# Patient Record
Sex: Male | Born: 1940 | Race: White | Hispanic: No | Marital: Married | Smoking: Former smoker
Health system: Southern US, Community
[De-identification: ages and names within clinical notes are randomized; demographics above are authoritative.]

## PROBLEM LIST (undated history)

## (undated) DIAGNOSIS — I10 Essential (primary) hypertension: Secondary | ICD-10-CM

## (undated) DIAGNOSIS — I4891 Unspecified atrial fibrillation: Secondary | ICD-10-CM

## (undated) DIAGNOSIS — N400 Enlarged prostate without lower urinary tract symptoms: Secondary | ICD-10-CM

## (undated) DIAGNOSIS — G459 Transient cerebral ischemic attack, unspecified: Secondary | ICD-10-CM

## (undated) HISTORY — PX: TONSILLECTOMY: SUR1361

---

## 2005-02-11 ENCOUNTER — Ambulatory Visit: Payer: Self-pay | Admitting: Gastroenterology

## 2006-03-31 ENCOUNTER — Ambulatory Visit: Payer: Self-pay | Admitting: Unknown Physician Specialty

## 2016-10-02 ENCOUNTER — Emergency Department
Admission: EM | Admit: 2016-10-02 | Discharge: 2016-10-02 | Disposition: A | Discharge status: Home / Self Care | Payer: Medicare Other | Attending: Student in an Organized Health Care Education/Training Program | Admitting: Student in an Organized Health Care Education/Training Program

## 2016-10-02 DIAGNOSIS — L230 Allergic contact dermatitis due to metals: Secondary | ICD-10-CM | POA: Diagnosis not present

## 2016-10-02 DIAGNOSIS — L089 Local infection of the skin and subcutaneous tissue, unspecified: Secondary | ICD-10-CM | POA: Diagnosis not present

## 2016-10-02 DIAGNOSIS — R21 Rash and other nonspecific skin eruption: Secondary | ICD-10-CM | POA: Diagnosis present

## 2016-10-02 MED ORDER — HYDROXYZINE HCL 25 MG PO TABS: 25.0000 mg | ORAL_TABLET | Freq: Three times a day (TID) | ORAL | 0 refills | Status: DC | PRN

## 2016-10-02 MED ORDER — SULFAMETHOXAZOLE-TRIMETHOPRIM 800-160 MG PO TABS: 1.0000 | ORAL_TABLET | Freq: Two times a day (BID) | ORAL | 0 refills | Status: DC

## 2016-10-02 MED ORDER — HYDROCORTISONE 1 % EX OINT: 1.0000 "application " | TOPICAL_OINTMENT | Freq: Two times a day (BID) | CUTANEOUS | 0 refills | Status: DC

## 2016-10-02 NOTE — ED Notes (Signed)
See triage note  States he developed a rash about 1-2  weeks ago  States areas are mainly to left hand between fingers

## 2016-10-02 NOTE — Discharge Instructions (Signed)
Advised Epsom salts soak 5-10 minutes twice a day.

## 2016-10-02 NOTE — ED Triage Notes (Signed)
Pt reports was at dinner party 2 weeks ago, pet a dog and shortly after broke into rash on left hand.

## 2016-10-02 NOTE — ED Provider Notes (Signed)
[facility] Emergency Department Provider Note   ____________________________________________   First MD Initiated Contact with Patient 10/02/16 1512     (approximate)  I have reviewed the triage vital signs and the nursing notes.   HISTORY  Chief Complaint Rash    HPI Ethan ACCOMANDO Sr. is a 76 y.o. male patient complaining of rash to the dorsal aspect the left hand for 2 weeks. [place] his [place]'s dog shortly after it came in from outside. Patient states the next morning he awakened with some vesicle lesions in intense itching on the left hand. No palliative measures for complaint. Patient presents today with edema to the left hand and ruptured vesicle lesions and erythema. Patient denies pain with this complaint.   History reviewed. No pertinent past medical history.  There are no active problems to display for this patient.   History reviewed. No pertinent surgical history.  Prior to Admission medications   Medication Sig Start Date End Date Taking? Authorizing Provider  diltiazem (CARTIA XT) 180 MG 24 hr capsule Take 180 mg by mouth daily.   Yes [provider]  rivaroxaban (XARELTO) 20 MG TABS tablet Take 20 mg by mouth daily with supper.   Yes [provider]  hydrocortisone 1 % ointment Apply 1 application topically 2 (two) times daily. 10/02/16   Joni Reining, PA-C  hydrOXYzine (ATARAX/VISTARIL) 25 MG tablet Take 1 tablet (25 mg total) by mouth 3 (three) times daily as needed. 10/02/16   Joni Reining, PA-C  sulfamethoxazole-trimethoprim (BACTRIM DS,SEPTRA DS) 800-160 MG tablet Take 1 tablet by mouth 2 (two) times daily. 10/02/16   Joni Reining, PA-C    Allergies Patient has no known allergies.  No family history on file.  Social History Social History  Substance Use Topics  . Smoking status: Never Smoker  . Smokeless tobacco: Never Used  . Alcohol use No     Review of Systems Constitutional: No fever/chills Eyes: No visual changes. ENT: No sore throat. Cardiovascular: Denies chest pain. Respiratory: Denies shortness of breath. Gastrointestinal: No abdominal pain.  No nausea, no vomiting.  No diarrhea.  No constipation. Genitourinary: Negative for dysuria. Musculoskeletal: Negative for back pain. Skin:positive for rash. Neurological: Negative for headaches, focal weakness or numbness.  ____________________________________________   PHYSICAL EXAM:  VITAL SIGNS: ED Triage Vitals [10/02/16 1405]  Enc Vitals Group     BP 129/86     Pulse Rate 82     Resp 18     Temp (!) 97.5 F (36.4 C)     Temp Source Oral     SpO2 99 %     Weight 175 lb (79.4 kg)     Height  (1.803 m)     Head Circumference      Peak Flow      Pain Score 0     Pain Loc      Pain Edu?      Excl. in GC?     Constitutional: Alert and oriented. Well appearing and in no acute distress. Cardiovascular: Normal rate, regular rhythm. Grossly normal heart sounds.  Good peripheral circulation. Respiratory: Normal respiratory effort.  No retractions. Lungs CTAB. Neurologic:  Normal speech and language. No gross focal neurologic deficits are appreciated. No gait instability. Skin:  Skin is warm, dry and intact.edematous erythematous dorsal aspect of the right hand withruptured vesicle lesions. Psychiatric: Mood and affect are normal. Speech and behavior are  normal.  ____________________________________________   LABS (all labs ordered are listed, but only abnormal results are displayed)  Labs Reviewed - No data to display ____________________________________________  EKG   ____________________________________________  RADIOLOGY  No results found.  ____________________________________________   PROCEDURES  Procedure(s) performed: None  Procedures  Critical Care performed: No  ____________________________________________   INITIAL  IMPRESSION / ASSESSMENT AND PLAN / ED COURSE  Pertinent labs & imaging results that were available during my care of the patient were reviewed by me and considered in my medical decision making (see chart for details).  Patient present with edema a to the dorsal aspect of the left hand for approximately 2 weeks. Patient given a history of contact dermatitis.atient given discharge care instructions. Patient advow-up PCP in one week if no improvement. Return by the ED if condition worsens.      ____________________________________________   FINAL CLINICAL IMPRESSION(S) / ED DIAGNOSES  Final diagnoses:  Allergic contact dermatitis due to metals  Bacterial skin infection of upper extremity      NEW MEDICATIONS STARTED DURING THIS VISIT:  New Prescriptions   HYDROCORTISONE 1 % OINTMENT    Apply 1 application topically 2 (two) times daily.   HYDROXYZINE (ATARAX/VISTARIL) 25 MG TABLET    Take 1 tablet (25 mg total) by mouth 3 (three) times daily as needed.   SULFAMETHOXAZOLE-TRIMETHOPRIM (BACTRIM DS,SEPTRA DS) 800-160 MG TABLET    Take 1 tablet by mouth 2 (two) times daily.     Note:  This document was prepared using Dragon voice recognition software and may include unintentional dictation errors.    Joni Reining, PA-C 10/02/16 1543    Willy Eddy, MD 10/02/16 [phone]

## 2020-01-13 ENCOUNTER — Ambulatory Visit (HOSPITAL_COMMUNITY)
Admission: RE | Admit: 2020-01-13 | Discharge: 2020-01-13 | Disposition: A | Discharge status: Home / Self Care | Payer: Medicare Other | Source: Ambulatory Visit | Attending: Pulmonary Disease | Admitting: Pulmonary Disease

## 2020-01-13 ENCOUNTER — Other Ambulatory Visit: Payer: Self-pay | Admitting: Adult Health

## 2020-01-13 ENCOUNTER — Other Ambulatory Visit: Payer: Self-pay | Admitting: Nurse Practitioner

## 2020-01-13 ENCOUNTER — Telehealth (HOSPITAL_COMMUNITY): Payer: Self-pay | Admitting: Nurse Practitioner

## 2020-01-13 DIAGNOSIS — U071 COVID-19: Secondary | ICD-10-CM

## 2020-01-13 DIAGNOSIS — Z23 Encounter for immunization: Secondary | ICD-10-CM | POA: Diagnosis not present

## 2020-01-13 MED ORDER — SODIUM CHLORIDE 0.9 % IV SOLN: Freq: Once | INTRAVENOUS | Status: DC

## 2020-01-13 MED ORDER — DIPHENHYDRAMINE HCL 50 MG/ML IJ SOLN: 50.0000 mg | Freq: Once | INTRAMUSCULAR | Status: DC | PRN

## 2020-01-13 MED ORDER — EPINEPHRINE 0.3 MG/0.3ML IJ SOAJ: 0.3000 mg | Freq: Once | INTRAMUSCULAR | Status: DC | PRN

## 2020-01-13 MED ORDER — METHYLPREDNISOLONE SODIUM SUCC 125 MG IJ SOLR: 125.0000 mg | Freq: Once | INTRAMUSCULAR | Status: DC | PRN

## 2020-01-13 MED ORDER — ALBUTEROL SULFATE HFA 108 (90 BASE) MCG/ACT IN AERS: 2.0000 | INHALATION_SPRAY | Freq: Once | RESPIRATORY_TRACT | Status: DC | PRN

## 2020-01-13 MED ORDER — FAMOTIDINE IN NACL 20-0.9 MG/50ML-% IV SOLN: 20.0000 mg | Freq: Once | INTRAVENOUS | Status: DC | PRN

## 2020-01-13 MED ORDER — SODIUM CHLORIDE 0.9 % IV SOLN: INTRAVENOUS | Status: DC | PRN

## 2020-01-13 MED ORDER — SODIUM CHLORIDE 0.9 % IV SOLN
1200.0000 mg | Freq: Once | INTRAVENOUS | Status: DC
  Administered 2020-01-13: 15:00:00 1200 mg via INTRAVENOUS

## 2020-01-13 NOTE — Telephone Encounter (Signed)
I connected by phone with Ethan Jude Sr. on 01/13/2020 at 1:24 PM to discuss the potential use of a treatment for mild to moderate COVID-19 viral infection in non-hospitalized patients.  This patient is a 79 y.o. male that meets the FDA criteria for Emergency Use Authorization of bamlanivimab/etesevimab, casirivimab\imdevimab, or sotrovimab  Has a (+) direct SARS-CoV-2 viral test result  Has mild or moderate COVID-19   Is ? 81 years of age and weighs ? 40 kg  Is NOT hospitalized due to COVID-19  Is NOT requiring oxygen therapy or requiring an increase in baseline oxygen flow rate due to COVID-19  Is within 10 days of symptom onset  Has at least one of the high risk factor(s) for progression to severe COVID-19 and/or hospitalization as defined in EUA.  Specific high risk criteria : Older age (>/= 81 yo)   Patient has received vaccines and booster. However, due to age, he is at risk of complications of covid. His wife received mab. Patient understands drug shortages and would like to be placed on cancellation list in case drug becomes available. Last date eligible for infusion based on narrowed criteria is: 12/31.    I have spoken and communicated the following to the patient or parent/caregiver:  1. FDA has authorized the emergency use of bamlanivimab/etesevimab, casirivimab\imdevimab, or sotrovimab for the treatment of mild to moderate COVID-19 in adults and pediatric patients with positive results of direct SARS-CoV-2 viral testing who are 70 years of age and older weighing at least 40 kg, and who are at high risk for progressing to severe COVID-19 and/or hospitalization.  2. The significant known and potential risks and benefits of bamlanivimab/etesevimab, casirivimab\imdevimab, or sotrovimab, and the extent to which such potential risks and benefits are unknown.  3. Information on available alternative treatments and the risks and benefits of those alternatives, including clinical  trials.  4. Patients treated with bamlanivimab/etesevimab, casirivimab\imdevimab, or sotrovimab should continue to self-isolate and use infection control measures (e.g., wear mask, isolate, social distance, avoid sharing personal items, clean and disinfect "high touch" surfaces, and frequent handwashing) according to CDC guidelines.   5. The patient or parent/caregiver has the option to accept or refuse bamlanivimab/etesevimab, casirivimab\imdevimab, or sotrovimab.  After reviewing this information with the patient, the patient has agreed to receive one of the available covid 19 monoclonal antibodies and will be provided an appropriate fact sheet prior to infusion.Consuello Masse, DNP, AGNP-C [phone] (Infusion Center Hotline)

## 2020-01-13 NOTE — Progress Notes (Signed)
Patient reviewed Fact Sheet for Patients, Parents, and Caregivers for Emergency Use Authorization (EUA) of casi for the Treatment of Coronavirus. Patient also reviewed and is agreeable to the estimated cost of treatment. Patient is agreeable to proceed.   

## 2020-01-13 NOTE — Progress Notes (Signed)
I connected by phone with Ethan Jude Sr. on 01/13/2020 at 3:08 PM to discuss the potential use of a new treatment for mild to moderate COVID-19 viral infection in non-hospitalized patients.  This patient is a 79 y.o. male that meets the FDA criteria for Emergency Use Authorization of COVID monoclonal antibody casirivimab/imdevimab, bamlanivimab/etesevimab, or sotrovimab.  Has a (+) direct SARS-CoV-2 viral test result  Has mild or moderate COVID-19   Is NOT hospitalized due to COVID-19  Is within 10 days of symptom onset  Has at least one of the high risk factor(s) for progression to severe COVID-19 and/or hospitalization as defined in EUA.  Specific high risk criteria : Older age (>/= 81 yo) and Cardiovascular disease or hypertension   I have spoken and communicated the following to the patient or parent/caregiver regarding COVID monoclonal antibody treatment:  1. FDA has authorized the emergency use for the treatment of mild to moderate COVID-19 in adults and pediatric patients with positive results of direct SARS-CoV-2 viral testing who are 51 years of age and older weighing at least 40 kg, and who are at high risk for progressing to severe COVID-19 and/or hospitalization.  2. The significant known and potential risks and benefits of COVID monoclonal antibody, and the extent to which such potential risks and benefits are unknown.  3. Information on available alternative treatments and the risks and benefits of those alternatives, including clinical trials.  4. Patients treated with COVID monoclonal antibody should continue to self-isolate and use infection control measures (e.g., wear mask, isolate, social distance, avoid sharing personal items, clean and disinfect "high touch" surfaces, and frequent handwashing) according to CDC guidelines.   5. The patient or parent/caregiver has the option to accept or refuse COVID monoclonal antibody treatment.  After reviewing this information  with the patient, the patient has agreed to receive one of the available covid 19 monoclonal antibodies and will be provided an appropriate fact sheet prior to infusion. Noreene Filbert, NP 01/13/2020 3:08 PM

## 2020-01-13 NOTE — Progress Notes (Signed)
  Diagnosis: COVID-19  Physician:  Patrick Wright  Procedure: Covid Infusion Clinic Med: casirivimab\imdevimab infusion - Provided patient with casirivimab\imdevimab fact sheet for patients, parents and caregivers prior to infusion.  Complications: No immediate complications noted.  Discharge: Discharged home   Nicolle Heward L 01/13/2020   

## 2020-01-13 NOTE — Discharge Instructions (Addendum)
10 Things You Can Do to Manage Your COVID-19 Symptoms at Home If you have possible or confirmed COVID-19: 1. Stay home from work and school. And stay away from other public places. If you must go out, avoid using any kind of public transportation, ridesharing, or taxis. 2. Monitor your symptoms carefully. If your symptoms get worse, call your healthcare provider immediately. 3. Get rest and stay hydrated. 4. If you have a medical appointment, call the healthcare provider ahead of time and tell them that you have or may have COVID-19. 5. For medical emergencies, call 911 and notify the dispatch personnel that you have or may have COVID-19. 6. Cover your cough and sneezes with a tissue or use the inside of your elbow. 7. Wash your hands often with soap and water for at least 20 seconds or clean your hands with an alcohol-based hand sanitizer that contains at least 60% alcohol. 8. As much as possible, stay in a specific room and away from other people in your home. Also, you should use a separate bathroom, if available. If you need to be around other people in or outside of the home, wear a mask. 9. Avoid sharing personal items with other people in your household, like dishes, towels, and bedding. 10. Clean all surfaces that are touched often, like counters, tabletops, and doorknobs. Use household cleaning sprays or wipes according to the label instructions. cdc.gov/coronavirus 07/18/2018 This information is not intended to replace advice given to you by your health care provider. Make sure you discuss any questions you have with your health care provider. Document Revised: 12/20/2018 Document Reviewed: 12/20/2018 Elsevier Patient Education  2020 Elsevier Inc. What types of side effects do monoclonal antibody drugs cause?  Common side effects  In general, the more common side effects caused by monoclonal antibody drugs include: . Allergic reactions, such as hives or itching . Flu-like signs and  symptoms, including chills, fatigue, fever, and muscle aches and pains . Nausea, vomiting . Diarrhea . Skin rashes . Low blood pressure   The CDC is recommending patients who receive monoclonal antibody treatments wait at least 90 days before being vaccinated.  Currently, there are no data on the safety and efficacy of mRNA COVID-19 vaccines in persons who received monoclonal antibodies or convalescent plasma as part of COVID-19 treatment. Based on the estimated half-life of such therapies as well as evidence suggesting that reinfection is uncommon in the 90 days after initial infection, vaccination should be deferred for at least 90 days, as a precautionary measure until additional information becomes available, to avoid interference of the antibody treatment with vaccine-induced immune responses. If you have any questions or concerns after the infusion please call the Advanced Practice Provider on call at [phone]. This number is ONLY intended for your use regarding questions or concerns about the infusion post-treatment side-effects.  Please do not provide this number to others for use. For return to work notes please contact your primary care provider.   If someone you know is interested in receiving treatment please have them call the COVID hotline at [phone].   

## 2020-07-24 ENCOUNTER — Other Ambulatory Visit: Payer: Self-pay

## 2020-07-24 ENCOUNTER — Emergency Department
Admission: EM | Admit: 2020-07-24 | Discharge: 2020-07-24 | Disposition: A | Discharge status: Home / Self Care | Payer: Medicare Other | Attending: Emergency Medicine | Admitting: Emergency Medicine

## 2020-07-24 DIAGNOSIS — R040 Epistaxis: Secondary | ICD-10-CM | POA: Insufficient documentation

## 2020-07-24 LAB — CBC
HCT: 45.1 % (ref 39.0–52.0)
Hemoglobin: 15.3 g/dL (ref 13.0–17.0)
MCH: 32.2 pg (ref 26.0–34.0)
MCHC: 33.9 g/dL (ref 30.0–36.0)
MCV: 94.9 fL (ref 80.0–100.0)
Platelets: 224 10*3/uL (ref 150–400)
RBC: 4.75 MIL/uL (ref 4.22–5.81)
RDW: 16.3 % — ABNORMAL HIGH (ref 11.5–15.5)
WBC: 10.7 10*3/uL — ABNORMAL HIGH (ref 4.0–10.5)
nRBC: 0 % (ref 0.0–0.2)

## 2020-07-24 MED ORDER — OXYMETAZOLINE HCL 0.05 % NA SOLN
1.0000 | Freq: Once | NASAL | Status: AC
  Administered 2020-07-24: 1 via NASAL
  Filled 2020-07-24: qty 30

## 2020-07-24 NOTE — ED Triage Notes (Signed)
Pt to ER via POV with complaints of a nose bleed on the right side that he has been unable to stop bleeding. Reports a steady trickle of blood that started last night. Reports taking xalerto (has afib).

## 2020-07-24 NOTE — ED Provider Notes (Signed)
[facility] Emergency Department Provider Note ____________________________________________   Event Date/Time   First MD Initiated Contact with Patient 07/24/20 1741     (approximate)  I have reviewed the triage vital signs and the nursing notes.   HISTORY  Chief Complaint Epistaxis  HPI Ethan Ayers is a 80 y.o. male with history of a-fib on xarelto presents to the emergency department for treatment and evaluation of right epistaxis that started about 10am. He has packed it with toilet paper, but states it has still trickled all day. He has not clamped it or applied pressure.     No past medical history on file.  There are no problems to display for this patient.   No past surgical history on file.  Prior to Admission medications   Medication Sig Start Date End Date Taking? Authorizing Provider  diltiazem (CARTIA XT) 180 MG 24 hr capsule Take 180 mg by mouth daily.    [provider]  hydrocortisone 1 % ointment Apply 1 application topically 2 (two) times daily. 10/02/16   Joni Reining, PA-C  hydrOXYzine (ATARAX/VISTARIL) 25 MG tablet Take 1 tablet (25 mg total) by mouth 3 (three) times daily as needed. 10/02/16   Joni Reining, PA-C  rivaroxaban (XARELTO) 20 MG TABS tablet Take 20 mg by mouth daily with supper.    [provider]    Allergies Patient has no known allergies.  No family history on file.  Social History Social History   Tobacco Use   Smoking status: Never   Smokeless tobacco: Never  Substance Use Topics   Alcohol use: No   Drug use: No    Review of Systems  Constitutional: No fever/chills Eyes: No visual changes. ENT: Positive for epistaxis Cardiovascular: Denies chest pain. Respiratory: Denies shortness of breath. Gastrointestinal: No abdominal pain.  No nausea, no vomiting.  No diarrhea.  No constipation. Genitourinary: Negative for dysuria. Musculoskeletal: Negative for back pain. Skin:  Negative for rash. Neurological: Negative for headaches, focal weakness or numbness. ____________________________________________   PHYSICAL EXAM:  VITAL SIGNS: ED Triage Vitals  Enc Vitals Group     BP 07/24/20 1731 (!) 147/98     Pulse Rate 07/24/20 1731 85     Resp 07/24/20 1731 16     Temp 07/24/20 1731 (!) 96.9 F (36.1 C)     Temp Source 07/24/20 1731 Axillary     SpO2 07/24/20 1731 98 %     Weight 07/24/20 1732 165 lb (74.8 kg)     Height 07/24/20 1732 5\' 11"  (1.803 m)     Head Circumference --      Peak Flow --      Pain Score 07/24/20 1732 0     Pain Loc --      Pain Edu? --      Excl. in GC? --     Constitutional: Alert and oriented. Well appearing and in no acute distress. Eyes: Conjunctivae are normal. PERRL. EOMI. Head: Atraumatic. Nose: Toilet paper packing and large clot pulled from right nostril. Mouth/Throat: Mucous membranes are moist.  Oropharynx non-erythematous. No obvious blood in posterior oropharynx. Neck: No stridor.   Hematological/Lymphatic/Immunilogical: No cervical lymphadenopathy. Cardiovascular: Normal rate, regular rhythm. Grossly normal heart sounds.  Good peripheral circulation. Respiratory: Normal respiratory effort.  No retractions. Lungs CTAB. Gastrointestinal: Soft and nontender. No distention. No abdominal bruits. No CVA tenderness. Genitourinary:  Musculoskeletal: No lower extremity tenderness nor edema.  No joint effusions. Neurologic:  Normal speech and language.  No gross focal neurologic deficits are appreciated. No gait instability. Skin:  Skin is warm, dry and intact. No rash noted. Psychiatric: Mood and affect are normal. Speech and behavior are normal.  ____________________________________________   LABS (all labs ordered are listed, but only abnormal results are displayed)  Labs Reviewed  CBC - Abnormal; Notable for the following components:      Result Value   WBC 10.7 (*)    RDW 16.3 (*)    All other components  within normal limits   ____________________________________________  EKG  Not indicated. ____________________________________________  RADIOLOGY  ED MD interpretation:    Not indicated.  I, Kem Boroughs, personally viewed and evaluated these images (plain radiographs) as part of my medical decision making, as well as reviewing the written report by the radiologist.  Official radiology report(s): No results found.  ____________________________________________   PROCEDURES  Procedure(s) performed (including Critical Care):  Procedures  ____________________________________________   INITIAL IMPRESSION / ASSESSMENT AND PLAN     80 year old male presenting to the ER for treatment of epistaxis. See HPI and assessment. Neosynephrine nasal spray instilled into right nostril and clamp applied.  DIFFERENTIAL DIAGNOSIS  Epistaxis; anemia  ED COURSE  CBC reassuring. Clamp left in place 10 minutes then removed. Will reassess in about 20 minutes for return of bleeding.  No bleeding after clamp being off for 25 minutes. Nasal spray and nose clamp given to patient who was advised to insert 2 sprays and clamp for 10-15 minutes if bleeding returns. If after that time bleeding has not stopped, he is to reapply the clamp and return to the ER for packing.     ___________________________________________   FINAL CLINICAL IMPRESSION(S) / ED DIAGNOSES  Final diagnoses:  Epistaxis     ED Discharge Orders     None        [name] was evaluated in Emergency Department on 07/24/2020 for the symptoms described in the history of present illness. He was evaluated in the context of the global COVID-19 pandemic, which necessitated consideration that the patient might be at risk for infection with the SARS-CoV-2 virus that causes COVID-19. Institutional protocols and algorithms that pertain to the evaluation of patients at risk for COVID-19 are in a state of rapid change based on  information released by regulatory bodies including the CDC and federal and state organizations. These policies and algorithms were followed during the patient's care in the ED.   Note:  This document was prepared using Dragon voice recognition software and may include unintentional dictation errors.    Chinita Pester, FNP 07/24/20 2208    Chesley Noon, MD 07/28/20 [phone]

## 2020-08-18 ENCOUNTER — Other Ambulatory Visit: Payer: Self-pay | Admitting: Orthopedic Surgery

## 2020-08-18 DIAGNOSIS — M5442 Lumbago with sciatica, left side: Secondary | ICD-10-CM

## 2020-08-18 DIAGNOSIS — M5441 Lumbago with sciatica, right side: Secondary | ICD-10-CM

## 2020-08-18 DIAGNOSIS — M5416 Radiculopathy, lumbar region: Secondary | ICD-10-CM

## 2020-08-27 ENCOUNTER — Ambulatory Visit
Admission: RE | Admit: 2020-08-27 | Discharge: 2020-08-27 | Disposition: A | Discharge status: Home / Self Care | Payer: Medicare Other | Source: Ambulatory Visit | Attending: Orthopedic Surgery | Admitting: Orthopedic Surgery

## 2020-08-27 ENCOUNTER — Other Ambulatory Visit: Payer: Self-pay

## 2020-08-27 DIAGNOSIS — M5441 Lumbago with sciatica, right side: Secondary | ICD-10-CM | POA: Diagnosis present

## 2020-08-27 DIAGNOSIS — M5442 Lumbago with sciatica, left side: Secondary | ICD-10-CM | POA: Diagnosis not present

## 2020-08-27 DIAGNOSIS — M5416 Radiculopathy, lumbar region: Secondary | ICD-10-CM | POA: Diagnosis present

## 2020-09-22 ENCOUNTER — Emergency Department: Payer: Medicare Other

## 2020-09-22 ENCOUNTER — Other Ambulatory Visit: Payer: Self-pay

## 2020-09-22 ENCOUNTER — Observation Stay
Admission: EM | Admit: 2020-09-22 | Discharge: 2020-09-24 | Disposition: A | Discharge status: Home / Self Care | Payer: Medicare Other | Attending: Internal Medicine | Admitting: Internal Medicine

## 2020-09-22 DIAGNOSIS — Z7901 Long term (current) use of anticoagulants: Secondary | ICD-10-CM | POA: Diagnosis not present

## 2020-09-22 DIAGNOSIS — R2681 Unsteadiness on feet: Secondary | ICD-10-CM | POA: Diagnosis not present

## 2020-09-22 DIAGNOSIS — Z20822 Contact with and (suspected) exposure to covid-19: Secondary | ICD-10-CM | POA: Insufficient documentation

## 2020-09-22 DIAGNOSIS — R4702 Dysphasia: Secondary | ICD-10-CM | POA: Diagnosis not present

## 2020-09-22 DIAGNOSIS — Z79899 Other long term (current) drug therapy: Secondary | ICD-10-CM | POA: Diagnosis not present

## 2020-09-22 DIAGNOSIS — I10 Essential (primary) hypertension: Secondary | ICD-10-CM | POA: Diagnosis not present

## 2020-09-22 DIAGNOSIS — R1310 Dysphagia, unspecified: Secondary | ICD-10-CM | POA: Insufficient documentation

## 2020-09-22 DIAGNOSIS — Y900 Blood alcohol level of less than 20 mg/100 ml: Secondary | ICD-10-CM | POA: Insufficient documentation

## 2020-09-22 DIAGNOSIS — R471 Dysarthria and anarthria: Secondary | ICD-10-CM | POA: Insufficient documentation

## 2020-09-22 DIAGNOSIS — R4182 Altered mental status, unspecified: Principal | ICD-10-CM | POA: Diagnosis present

## 2020-09-22 DIAGNOSIS — I482 Chronic atrial fibrillation, unspecified: Secondary | ICD-10-CM | POA: Diagnosis not present

## 2020-09-22 DIAGNOSIS — R4701 Aphasia: Secondary | ICD-10-CM

## 2020-09-22 DIAGNOSIS — R2990 Unspecified symptoms and signs involving the nervous system: Secondary | ICD-10-CM

## 2020-09-22 DIAGNOSIS — R410 Disorientation, unspecified: Secondary | ICD-10-CM | POA: Diagnosis not present

## 2020-09-22 LAB — DIFFERENTIAL
Abs Immature Granulocytes: 0.08 10*3/uL — ABNORMAL HIGH (ref 0.00–0.07)
Basophils Absolute: 0.1 10*3/uL (ref 0.0–0.1)
Basophils Relative: 1 %
Eosinophils Absolute: 0.5 10*3/uL (ref 0.0–0.5)
Eosinophils Relative: 5 %
Immature Granulocytes: 1 %
Lymphocytes Relative: 19 %
Lymphs Abs: 1.7 10*3/uL (ref 0.7–4.0)
Monocytes Absolute: 1.1 10*3/uL — ABNORMAL HIGH (ref 0.1–1.0)
Monocytes Relative: 12 %
Neutro Abs: 5.9 10*3/uL (ref 1.7–7.7)
Neutrophils Relative %: 62 %

## 2020-09-22 LAB — COMPREHENSIVE METABOLIC PANEL
ALT: 19 U/L (ref 0–44)
AST: 25 U/L (ref 15–41)
Albumin: 3.5 g/dL (ref 3.5–5.0)
Alkaline Phosphatase: 68 U/L (ref 38–126)
Anion gap: 8 (ref 5–15)
BUN: 22 mg/dL (ref 8–23)
CO2: 26 mmol/L (ref 22–32)
Calcium: 8.8 mg/dL — ABNORMAL LOW (ref 8.9–10.3)
Chloride: 101 mmol/L (ref 98–111)
Creatinine, Ser: 1.39 mg/dL — ABNORMAL HIGH (ref 0.61–1.24)
GFR, Estimated: 51 mL/min — ABNORMAL LOW (ref >60)
Glucose, Bld: 102 mg/dL — ABNORMAL HIGH (ref 70–99)
Potassium: 3.6 mmol/L (ref 3.5–5.1)
Sodium: 135 mmol/L (ref 135–145)
Total Bilirubin: 0.5 mg/dL (ref 0.3–1.2)
Total Protein: 6.7 g/dL (ref 6.5–8.1)

## 2020-09-22 LAB — CBC
HCT: 36.3 % — ABNORMAL LOW (ref 39.0–52.0)
Hemoglobin: 12.9 g/dL — ABNORMAL LOW (ref 13.0–17.0)
MCH: 33.1 pg (ref 26.0–34.0)
MCHC: 35.5 g/dL (ref 30.0–36.0)
MCV: 93.1 fL (ref 80.0–100.0)
Platelets: 295 10*3/uL (ref 150–400)
RBC: 3.9 MIL/uL — ABNORMAL LOW (ref 4.22–5.81)
RDW: 14.4 % (ref 11.5–15.5)
WBC: 9.4 10*3/uL (ref 4.0–10.5)
nRBC: 0 % (ref 0.0–0.2)

## 2020-09-22 LAB — CBG MONITORING, ED: Glucose-Capillary: 98 mg/dL (ref 70–99)

## 2020-09-22 LAB — RESP PANEL BY RT-PCR (FLU A&B, COVID) ARPGX2
Influenza A by PCR: NEGATIVE
Influenza B by PCR: NEGATIVE
SARS Coronavirus 2 by RT PCR: NEGATIVE

## 2020-09-22 LAB — PROTIME-INR
INR: 3.6 — ABNORMAL HIGH (ref 0.8–1.2)
Prothrombin Time: 35.6 seconds — ABNORMAL HIGH (ref 11.4–15.2)

## 2020-09-22 LAB — APTT: aPTT: 45 seconds — ABNORMAL HIGH (ref 24–36)

## 2020-09-22 MED ORDER — MAGNESIUM HYDROXIDE 400 MG/5ML PO SUSP
30.0000 mL | Freq: Every day | ORAL | Status: DC | PRN
  Filled 2020-09-22: qty 30

## 2020-09-22 MED ORDER — ACETAMINOPHEN 650 MG RE SUPP: 650.0000 mg | Freq: Four times a day (QID) | RECTAL | Status: DC | PRN

## 2020-09-22 MED ORDER — RIVAROXABAN 20 MG PO TABS
20.0000 mg | ORAL_TABLET | Freq: Every day | ORAL | Status: DC
  Administered 2020-09-23: 17:00:00 20 mg via ORAL
  Filled 2020-09-22 (×2): qty 1

## 2020-09-22 MED ORDER — ONDANSETRON HCL 4 MG PO TABS: 4.0000 mg | ORAL_TABLET | Freq: Four times a day (QID) | ORAL | Status: DC | PRN

## 2020-09-22 MED ORDER — ACETAMINOPHEN 325 MG PO TABS: 650.0000 mg | ORAL_TABLET | Freq: Four times a day (QID) | ORAL | Status: DC | PRN

## 2020-09-22 MED ORDER — ASPIRIN EC 81 MG PO TBEC
81.0000 mg | DELAYED_RELEASE_TABLET | Freq: Every day | ORAL | Status: DC
  Administered 2020-09-23: 81 mg via ORAL
  Filled 2020-09-22: qty 1

## 2020-09-22 MED ORDER — DILTIAZEM HCL ER COATED BEADS 120 MG PO CP24
120.0000 mg | ORAL_CAPSULE | Freq: Every day | ORAL | Status: DC
  Administered 2020-09-23 – 2020-09-24 (×2): 120 mg via ORAL
  Filled 2020-09-22 (×2): qty 1

## 2020-09-22 MED ORDER — ONDANSETRON HCL 4 MG/2ML IJ SOLN: 4.0000 mg | Freq: Four times a day (QID) | INTRAMUSCULAR | Status: DC | PRN

## 2020-09-22 MED ORDER — SODIUM CHLORIDE 0.9 % IV SOLN: INTRAVENOUS | Status: DC

## 2020-09-22 MED ORDER — TAMSULOSIN HCL 0.4 MG PO CAPS
0.4000 mg | ORAL_CAPSULE | Freq: Every day | ORAL | Status: DC
  Administered 2020-09-23 – 2020-09-24 (×2): 0.4 mg via ORAL
  Filled 2020-09-22 (×2): qty 1

## 2020-09-22 MED ORDER — TRAZODONE HCL 50 MG PO TABS: 25.0000 mg | ORAL_TABLET | Freq: Every evening | ORAL | Status: DC | PRN

## 2020-09-22 MED ORDER — STROKE: EARLY STAGES OF RECOVERY BOOK: Freq: Once | Status: AC

## 2020-09-22 NOTE — Progress Notes (Signed)
CODE STROKE- PHARMACY COMMUNICATION   Time CODE STROKE called/page received:2125  Time response to CODE STROKE was made (in person or via phone): 2130  Time Stroke Kit retrieved from [place] (only if needed):N/A  Name of Provider/Nurse contacted:Beth  Last dose Xarelto 5:15pm this evening per report  No past medical history on file. Prior to Admission medications   Medication Sig Start Date End Date Taking? Authorizing Provider  diltiazem (CARTIA XT) 180 MG 24 hr capsule Take 180 mg by mouth daily.    [provider]  hydrocortisone 1 % ointment Apply 1 application topically 2 (two) times daily. 10/02/16   Sable Feil, PA-C  hydrOXYzine (ATARAX/VISTARIL) 25 MG tablet Take 1 tablet (25 mg total) by mouth 3 (three) times daily as needed. 10/02/16   Sable Feil, PA-C  rivaroxaban (XARELTO) 20 MG TABS tablet Take 20 mg by mouth daily with supper.    [provider]    Lu Duffel ,PharmD Clinical Pharmacist  09/22/2020  9:25 PM

## 2020-09-22 NOTE — ED Notes (Signed)
Dr. Laural Benes speaking with patient via teleneurology

## 2020-09-22 NOTE — ED Notes (Signed)
MD back at the bedside 

## 2020-09-22 NOTE — ED Provider Notes (Signed)
[facility] Emergency Department Provider Note   ____________________________________________   None    (approximate)  I have reviewed the triage vital signs and the nursing notes.   HISTORY  Chief Complaint Altered Mental Status (Pt's wife called EMS for altered mental status onset around 1745. Pt normally A&Ox4 at baseline but was only x2 today with slurred speech that was resolved prior to EMS arrival. Pt's wife reports similar episodes over past couple months. Pt has current prostate infection and is taking cipro x 7 days. )  EM caveat acuity, patient with difficulty speaking and what appears to be expressive aphasia  HPI Ethan Ayers is a 80 y.o. male with a history of A. Fib  A wife with him reports that he was doing fine at 5:30 PM.  He had Xarelto at 5:30 PM this evening which is his typical medication for A. fib.  At 6 PM he was getting ready to say the blessing for dinner when she noticed that he was having difficulty speaking could not get his words to form correctly.  Wife called EMS this evening as persistent difficulty with speaking and seems to be slightly confused.  He has been and is currently on about 1 week of treatment for ciprofloxacin for prostatitis as well seems to be improving  He is otherwise been in his normal state of health  Patient himself having some difficulty speaking, seems slightly frustrated with difficulty getting words.  He denies being any pain or discomfort though.  He does nod and speaks but with some difficulty that he is having trouble speaking   No past medical history on file.  Patient Active Problem List   Diagnosis Date Noted   Altered mental status 09/22/2020    No past surgical history on file.  Prior to Admission medications   Medication Sig Start Date End Date Taking? Authorizing Provider  diltiazem (CARDIZEM CD) 120 MG 24 hr capsule Take 120 mg by mouth daily. 07/23/20  Yes [provider]  rivaroxaban (XARELTO) 20 MG TABS tablet Take 20 mg by mouth daily with supper.   Yes [provider]  tamsulosin (FLOMAX) 0.4 MG CAPS capsule Take 0.4 mg by mouth daily. 09/08/20  Yes [provider]  hydrocortisone 1 % ointment Apply 1 application topically 2 (two) times daily. Patient not taking: Reported on 09/22/2020 10/02/16   Joni Reining, PA-C  hydrOXYzine (ATARAX/VISTARIL) 25 MG tablet Take 1 tablet (25 mg total) by mouth 3 (three) times daily as needed. Patient not taking: Reported on 09/22/2020 10/02/16   Joni Reining, PA-C    Allergies Patient has no known allergies.  No family history on file.  Social History Social History   Tobacco Use   Smoking status: Never   Smokeless tobacco: Never  Substance Use Topics   Alcohol use: No   Drug use: No    Review of Systems Constitutional: No fever/chills but did recently get treated for prostatitis Eyes: No visual changes. ENT: No pain.  No neck pain Cardiovascular: Denies chest pain. Respiratory: Denies shortness of breath. Gastrointestinal: No abdominal pain.   Genitourinary: Treatment for prostatitis Skin: Negative for rash. Neurological: Negative for headaches or areas of weakness but having difficulty speaking.    ____________________________________________   PHYSICAL EXAM:  VITAL SIGNS: ED Triage Vitals  Enc Vitals Group     BP 09/22/20 2108 (!) 155/105     Pulse Rate 09/22/20 2108 89     Resp 09/22/20 2108 20  Temp 09/22/20 2108 98 F (36.7 C)     Temp Source 09/22/20 2108 Oral     SpO2 09/22/20 2107 96 %     Weight 09/22/20 2111 233 lb 4 oz (105.8 kg)     Height 09/22/20 2111 5\' 11"  (1.803 m)     Head Circumference --      Peak Flow --      Pain Score 09/22/20 2111 0     Pain Loc --      Pain Edu? --      Excl. in GC? --     Constitutional: Alert and oriented to self and being in hospital. Well appearing and in no acute distress. Eyes: Conjunctivae are normal. Head:  Atraumatic. Nose: No congestion/rhinnorhea. Mouth/Throat: Mucous membranes are moist. Neck: No stridor.  Cardiovascular: Normal rate, irregular rhythm. Grossly normal heart sounds.  Good peripheral circulation. Respiratory: Normal respiratory effort.  No retractions. Lungs CTAB. Gastrointestinal: Soft and nontender. No distention. Musculoskeletal: No lower extremity tenderness nor edema. Neurologic:  Normal speech and language. No gross focal neurologic deficits are appreciated. NIH = 3. VAN negative (no noted muscular weakness)  Skin:  Skin is warm, dry and intact. No rash noted. Psychiatric: Mood and affect are normal. Speech and behavior are normal.  ____________________________________________   LABS (all labs ordered are listed, but only abnormal results are displayed)  Labs Reviewed  PROTIME-INR - Abnormal; Notable for the following components:      Result Value   Prothrombin Time 35.6 (*)    INR 3.6 (*)    All other components within normal limits  APTT - Abnormal; Notable for the following components:   aPTT 45 (*)    All other components within normal limits  CBC - Abnormal; Notable for the following components:   RBC 3.90 (*)    Hemoglobin 12.9 (*)    HCT 36.3 (*)    All other components within normal limits  DIFFERENTIAL - Abnormal; Notable for the following components:   Monocytes Absolute 1.1 (*)    Abs Immature Granulocytes 0.08 (*)    All other components within normal limits  COMPREHENSIVE METABOLIC PANEL - Abnormal; Notable for the following components:   Glucose, Bld 102 (*)    Creatinine, Ser 1.39 (*)    Calcium 8.8 (*)    GFR, Estimated 51 (*)    All other components within normal limits  RESP PANEL BY RT-PCR (FLU A&B, COVID) ARPGX2  URINE DRUG SCREEN, QUALITATIVE ([facility] ONLY)  URINALYSIS, ROUTINE W REFLEX MICROSCOPIC  ETHANOL  HEMOGLOBIN A1C  LIPID PANEL  BASIC METABOLIC PANEL  CBC  CBG MONITORING, ED    ____________________________________________  EKG  Is reviewed inter by me at 2115 Heart rate 99 QRS 89 QTc 460 A. fib, no evidence of ischemia ____________________________________________  RADIOLOGY  CT HEAD CODE STROKE WO CONTRAST  Result Date: 09/22/2020 CLINICAL DATA:  Code stroke. Initial evaluation for altered mental status, slurred speech. EXAM: CT HEAD WITHOUT CONTRAST TECHNIQUE: Contiguous axial images were obtained from the base of the skull through the vertex without intravenous contrast. COMPARISON:  None available. FINDINGS: Brain: Moderately advanced age-related cerebral atrophy with chronic small vessel ischemic disease. No acute intracranial hemorrhage. No visible acute large vessel territory infarct. No mass lesion, midline shift or mass effect. No hydrocephalus or extra-axial fluid collection. Vascular: No convincing hyperdense vessel. Scattered vascular calcifications noted within the carotid siphons. Skull: Scalp soft tissues and calvarium within normal limits. Sinuses/Orbits: Globes and orbital soft tissues demonstrate no acute  finding. Mild scattered mucosal thickening noted within the ethmoidal air cells and maxillary sinuses. Paranasal sinuses are otherwise clear. No mastoid effusion. Other: None. ASPECTS [facility] Stroke Program Early CT Score) - Ganglionic level infarction (caudate, lentiform nuclei, internal capsule, insula, M1-M3 cortex): 7 - Supraganglionic infarction (M4-M6 cortex): 3 Total score (0-10 with 10 being normal): 10 IMPRESSION: 1. No acute intracranial infarct or other abnormality. 2. ASPECTS is 10. 3. 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 Critical Value/emergent results were called by telephone at the time of interpretation on 09/22/2020 at 9:33 pm to provider Haylin Camilli , who verbally acknowledged these results. Electronically Signed   By: Rise Mu M.D.   On: 09/22/2020 21:36     CT head reviewed and  discussed with radiologist negative for acute infarct or hemorrhage. ____________________________________________   PROCEDURES  Procedure(s) performed: None  Procedures  Critical Care performed: Yes, see critical care note(s)  CRITICAL CARE Performed by: Sharyn Creamer   Total critical care time: 30 minutes  Critical care time was exclusive of separately billable procedures and treating other patients.  Critical care was necessary to treat or prevent imminent or life-threatening deterioration.  Critical care was time spent personally by me on the following activities: development of treatment plan with patient and/or surrogate as well as nursing, discussions with consultants, evaluation of patient's response to treatment, examination of patient, obtaining history from patient or surrogate, ordering and performing treatments and interventions, ordering and review of laboratory studies, ordering and review of radiographic studies, pulse oximetry and re-evaluation of patient's condition.  ____________________________________________   INITIAL IMPRESSION / ASSESSMENT AND PLAN / ED COURSE  Pertinent labs & imaging results that were available during my care of the patient were reviewed by me and considered in my medical decision making (see chart for details).   Patient presents for somewhat acute onset of dysarthria and confusion.  In the setting of A. fib in the immediacy of symptoms of stroke or intracranial hemorrhage are high in the differential, or other potential acute sudden onset etiology neurologic etiology, toxic metabolic etc.  No cardiac symptoms.  No chest pain.  Did have recent illness including prostatitis and on ciprofloxacin which could be a potential causative agent, but unclear at this time without further work-up which is ordered and pending  Clinical Course as of 09/22/20 2350  Tue Sep 22, 2020  2121 I evaluated the patient shortly after his arrival, noted to have  expressive aphasia.  His wife affirms the same notes that this started approximately 6 PM when he had difficulty saying the blessing at dinner.  He does have a history of A. fib and we discussed and he is on Xarelto which the patient's wife advises he took at 5:30 PM this evening.  Patient activated as an acute code stroke within the window for possible thrombolytic however given his use of Xarelto which she is currently taking and last took at 5:30 PM he is not a thrombolytic candidate at this time.  Teleneurology stroke consult has been activated [MQ]  2136 D/W radiologist, CT head negative for acute finding. [MQ]  2200 Discussed with our telemetry neurologist Dr. Laural Benes.  He advises recommendation for admission with the work-up for stroke including MRI MRA, but does not see evidence of LVO.  MRI MRA could be part of his admission work-up.  In addition we will work-up broadly for acute metabolic encephalopathic etiologies as well, of note the patient's INR is 3.6 which seems unusually high  for patient on Xarelto.  Await metabolic panel including renal function.  No clinical history to suggest acute hemorrhage or bleeding [MQ]  2249 Admission discussed with Dr. Arville CareMansy [MQ]    Clinical Course User Index [MQ] Sharyn CreamerQuale, Belmont Valli, MD    Differential diagnosis for acute onset of confusion and dysarthria is broad, and will work that up broadly but in the immediate context code stroke activated.  Patient not a thrombolytic candidate given his use of Xarelto this evening  Patient does not have any clear infectious symptoms, but has been on treatment for prostatitis with ciprofloxacin.   No associated cardiac or pulmonary symptoms.  Vital signs reassuring with exception to hypertension we will monitor.  ----------------------------------------- 11:50 PM on 09/22/2020 ----------------------------------------- Admitted to hospitalist service for further  work-up ____________________________________________   FINAL CLINICAL IMPRESSION(S) / ED DIAGNOSES  Final diagnoses:  Disorientation  Expressive aphasia        Note:  This document was prepared using Dragon voice recognition software and may include unintentional dictation errors       Sharyn CreamerQuale, Zackary Mckeone, MD 09/22/20 2350

## 2020-09-22 NOTE — H&P (Signed)
[facility]   PATIENT NAME: Ethan Ayers    MR#:  [id number]  DATE OF BIRTH:  August 25, 1940  DATE OF ADMISSION:  09/22/2020  PRIMARY CARE PHYSICIAN: Danella Penton, MD   Patient is coming from: Home  REQUESTING/REFERRING PHYSICIAN: Sharyn Creamer, MD  CHIEF COMPLAINT:   Chief Complaint  Patient presents with   Altered Mental Status    Pt's wife called EMS for altered mental status onset around 1745. Pt normally A&Ox4 at baseline but was only x2 today with slurred speech that was resolved prior to EMS arrival. Pt's wife reports similar episodes over past couple months. Pt has current prostate infection and is taking cipro x 7 days.     HISTORY OF PRESENT ILLNESS:  Ethan Ayers is a 80 y.o. Caucasian male with medical history significant for paroxysmal atrial fibrillation, BPH and hypertension, who presented to the ER with acute onset of altered mental status with confusion as well as expressive dysphasia with dysarthria that started today prior to coming to the ER.  He denied any paresthesias or focal muscle weakness.  No headache or dizziness or blurred vision.  He denied any chest pain or palpitations.  He is usually alert and oriented x3 and during the interview is not oriented to time.  No fever or chills.  No chest pain or palpitations.  No bleeding diathesis.  He was recently treated 3 prostatitis with p.o. Cipro for about a week and has  1 tablet left.  He took his last Xarelto this p.m.  ED Course: When he came to the ER blood pressure was 155/105 and later 129/106 with otherwise normal vital signs.  Labs revealed a creatinine 1.39 with a BUN of 22 and otherwise unremarkable CMP.  CBC showed mild anemia with hemoglobin of 12.9 and hematocrit 36.3.  INR was 3.6 on p.o. Xarelto which she took this afternoon and PTT 35.6 with PTT of 45.  Influenza antigens and COVID-19 PCR came back negative. EKG as reviewed by me : EKG showed atrial fibrillation with controlled ventricular sponsor  89 with Q waves in anteroseptal leads. Imaging: Noncontrast head CT scan revealed no acute intracranial normalities.  It showed moderately advanced cerebral atrophy with chronic small vessel ischemic disease.  The patient will be admitted to an observation medically monitored bed for further evaluation and management. PAST MEDICAL HISTORY:  Chronic atrial fibrillation, BPH and hypertension. PAST SURGICAL HISTORY:  He remembers he had tonsillectomy. SOCIAL HISTORY:   Social History   Tobacco Use   Smoking status: Never   Smokeless tobacco: Never  Substance Use Topics   Alcohol use: No   No history of tobacco EtOH abuse or illicit drug use. FAMILY HISTORY:  Positive for coronary artery disease in his father and TIAs in his mother. DRUG ALLERGIES:  No Known Allergies  REVIEW OF SYSTEMS:   ROS As per history of present illness. All pertinent systems were reviewed above. Constitutional, HEENT, cardiovascular, respiratory, GI, GU, musculoskeletal, neuro, psychiatric, endocrine, integumentary and hematologic systems were reviewed and are otherwise negative/unremarkable except for positive findings mentioned above in the HPI.   MEDICATIONS AT HOME:   Prior to Admission medications   Medication Sig Start Date End Date Taking? Authorizing Provider  diltiazem (CARDIZEM CD) 120 MG 24 hr capsule Take 120 mg by mouth daily. 07/23/20  Yes [provider]  rivaroxaban (XARELTO) 20 MG TABS tablet Take 20 mg by mouth daily with supper.   Yes [provider]  tamsulosin (  FLOMAX) 0.4 MG CAPS capsule Take 0.4 mg by mouth daily. 09/08/20  Yes [provider]  hydrocortisone 1 % ointment Apply 1 application topically 2 (two) times daily. Patient not taking: Reported on 09/22/2020 10/02/16   Joni Reining, PA-C  hydrOXYzine (ATARAX/VISTARIL) 25 MG tablet Take 1 tablet (25 mg total) by mouth 3 (three) times daily as needed. Patient not taking: Reported on 09/22/2020 10/02/16    Joni Reining, PA-C      VITAL SIGNS:  Blood pressure (!) 129/106, pulse 91, temperature 98 F (36.7 C), temperature source Oral, resp. rate (!) 21, height 5\' 11"  (1.803 m), weight 105.8 kg, SpO2 94 %.  PHYSICAL EXAMINATION:  Physical Exam  GENERAL:  80 y.o.-year-old Caucasian male patient lying in the bed with no acute distress.  EYES: Pupils equal, round, reactive to light and accommodation. No scleral icterus. Extraocular muscles intact.  HEENT: Head atraumatic, normocephalic. Oropharynx and nasopharynx clear.  NECK:  Supple, no jugular venous distention. No thyroid enlargement, no tenderness.  LUNGS: Normal breath sounds bilaterally, no wheezing, rales,rhonchi or crepitation. No use of accessory muscles of respiration.  CARDIOVASCULAR: Regular rate and rhythm, S1, S2 normal. No murmurs, rubs, or gallops.  ABDOMEN: Soft, nondistended, nontender. Bowel sounds present. No organomegaly or mass.  EXTREMITIES: No pedal edema, cyanosis, or clubbing.  NEUROLOGIC: Cranial nerves II through XII are intact. Muscle strength 5/5 in all extremities. Sensation intact. Gait not checked.  PSYCHIATRIC: The patient is alert and oriented x 2 to person and to place but not to time.  Normal affect and good eye contact. SKIN: No obvious rash, lesion, or ulcer.   LABORATORY PANEL:   CBC Recent Labs  Lab 09/22/20 2117  WBC 9.4  HGB 12.9*  HCT 36.3*  PLT 295   ------------------------------------------------------------------------------------------------------------------  Chemistries  Recent Labs  Lab 09/22/20 2117  NA 135  K 3.6  CL 101  CO2 26  GLUCOSE 102*  BUN 22  CREATININE 1.39*  CALCIUM 8.8*  AST 25  ALT 19  ALKPHOS 68  BILITOT 0.5   ------------------------------------------------------------------------------------------------------------------  Cardiac Enzymes No results for input(s): TROPONINI in the last 168  hours. ------------------------------------------------------------------------------------------------------------------  RADIOLOGY:  CT HEAD CODE STROKE WO CONTRAST  Result Date: 09/22/2020 CLINICAL DATA:  Code stroke. Initial evaluation for altered mental status, slurred speech. EXAM: CT HEAD WITHOUT CONTRAST TECHNIQUE: Contiguous axial images were obtained from the base of the skull through the vertex without intravenous contrast. COMPARISON:  None available. FINDINGS: Brain: Moderately advanced age-related cerebral atrophy with chronic small vessel ischemic disease. No acute intracranial hemorrhage. No visible acute large vessel territory infarct. No mass lesion, midline shift or mass effect. No hydrocephalus or extra-axial fluid collection. Vascular: No convincing hyperdense vessel. Scattered vascular calcifications noted within the carotid siphons. Skull: Scalp soft tissues and calvarium within normal limits. Sinuses/Orbits: Globes and orbital soft tissues demonstrate no acute finding. Mild scattered mucosal thickening noted within the ethmoidal air cells and maxillary sinuses. Paranasal sinuses are otherwise clear. No mastoid effusion. Other: None. ASPECTS [facility] Stroke Program Early CT Score) - Ganglionic level infarction (caudate, lentiform nuclei, internal capsule, insula, M1-M3 cortex): 7 - Supraganglionic infarction (M4-M6 cortex): 3 Total score (0-10 with 10 being normal): 10 IMPRESSION: 1. No acute intracranial infarct or other abnormality. 2. ASPECTS is 10. 3. 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 Critical Value/emergent results were called by telephone at the time of interpretation on 09/22/2020 at 9:33 pm to provider MARK QUALE , who verbally acknowledged these  results. Electronically Signed   By: Rise Mu M.D.   On: 09/22/2020 21:36      IMPRESSION AND PLAN:  Active Problems:   Altered mental status  1.  Altered mental status with  confusion with associated expressive dysphagia and dysarthria, concerning for TIA, rule out evolving CVA. - The patient will be admitted to an observation medically monitored bed. - We will follow neurochecks every 4 hours for 24 hours. - We will obtain a brain MRI with MRA of the head and neck As well as 2D echo for further assessment. - The patient will be placed on aspirin and statin therapy. - We will check fasting lipids in AM. - Neurology consult will be obtained - I notified Dr. Iver Nestle about the patient. - PT/OT and ST consults will be obtained.  2.  BPH. - We will continue his Flomax.  3.  Chronic atrial fibrillation with controlled ventricular response. - We will continue his Xarelto and Cardizem CD.  4.  Essential hypertension. - We will continue his Cardizem CD.  DVT prophylaxis: Xarelto.   Code Status: full code Family Communication:  The plan of care was discussed in details with the patient (and his wife who was with him in the room). I answered all questions. The patient agreed to proceed with the above mentioned plan. Further management will depend upon hospital course. Disposition Plan: Back to previous home environment Consults called: Neurology. All the records are reviewed and case discussed with ED provider.  Status is: Observation  The patient remains OBS appropriate and will d/c before 2 midnights.  Dispo: The patient is from: Home              Anticipated d/c is to: Home              Patient currently is not medically stable to d/c.   Difficult to place patient No   TOTAL TIME TAKING CARE OF THIS PATIENT: 55 minutes.    Hannah Beat M.D on 09/22/2020 at 11:38 PM  [facility]   From 7 PM-7 AM, contact night-coverage [email or web address]  CC: Primary care physician; Danella Penton, MD

## 2020-09-22 NOTE — ED Notes (Signed)
Patient to CT via stretcher on monitor with RN

## 2020-09-22 NOTE — ED Notes (Signed)
Patient return from CT. Teleneurology at the bedside.

## 2020-09-22 NOTE — Consult Note (Signed)
[facility] Tele[facility]urology Consult Services   Date of Service:   09/22/2020 21:30:36  Diagnosis:       G94.3 - Encephalopathy in diseases classified elsewhere  Impression:      80 y/o with PMH of afib (on xarelto), HTN, on abx for prostatitis. LKW 17:45 with MS change, slurred speech (resolved), generalized weakness, right arm tingling. He denies other symptoms. NIHSS 2 (not oriented). NCCT head without acuity. Most likely is tox/metabolic encephalopathy but stroke is possible. He is not a candidate for thrombolysis (on xarelto). LVO unlikely.    Recommendations/Plan:  1) Unenhanced brain MRI, MRA head/neck, TTE, cardiac telemetry, evaluation for general medical causes of possible toxic/metabolic encephalopathy  2) Further recommendations per neurology pending the above results    Metrics: Last Known Well: 09/22/2020 17:45:29 [facility] Notification Time: 09/22/2020 21:30:36 Arrival Time: 09/22/2020 21:01:21 Stamp Time: 09/22/2020 21:30:36 Initial Response Time: 09/22/2020 21:32:52 Symptoms: ms change. NIHSS Start Assessment Time: 09/22/2020 21:45:47 Patient is not a candidate for Thrombolytic. Thrombolytic Medical Decision: 09/22/2020 21:44:13 Patient was not deemed candidate for Thrombolytic because of following reasons: Use of NOAs within 48 hours.  CT head showed no acute hemorrhage or acute core infarct.  ED Physician notified of diagnostic impression and management plan on 09/22/2020 21:49:47  Advanced Imaging: Advanced Imaging Not Recommended because: lvo unlikely   ------------------------------------------------------------------------------  History of Present Illness: Patient is a 80 year old Male.  Patient was brought by EMS for symptoms of ms change.  80 y/o with PMH of afib (on xarelto), HTN. LKW 17:45 with MS change, slurred speech (resolved), generalized weakness, right arm tingling. He denies other symptoms. His wife, Kathie Rhodes,  provides all history. He takes xarelto, last dose today.   Anticoagulant use:  No  Antiplatelet use: Yes xarelto  Allergies:  Reviewed    Examination: BP(165/90), Pulse(90), Blood Glucose(98) 1A: Level of Consciousness - Alert; keenly responsive + 0 1B: Ask Month and Age - Could Not Answer Either Question Correctly + 2 1C: Blink Eyes & Squeeze Hands - Performs Both Tasks + 0 2: Test Horizontal Extraocular Movements - Normal + 0 3: Test Visual Fields - No Visual Loss + 0 4: Test Facial Palsy (Use Grimace if Obtunded) - Normal symmetry + 0 5A: Test Left Arm Motor Drift - No Drift for 10 Seconds + 0 5B: Test Right Arm Motor Drift - No Drift for 10 Seconds + 0 6A: Test Left Leg Motor Drift - No Drift for 5 Seconds + 0 6B: Test Right Leg Motor Drift - No Drift for 5 Seconds + 0 7: Test Limb Ataxia (FNF/Heel-Shin) - No Ataxia + 0 8: Test Sensation - Normal; No sensory loss + 0 9: Test Language/Aphasia - Normal; No aphasia + 0 10: Test Dysarthria - Normal + 0 11: Test Extinction/Inattention - No abnormality + 0  NIHSS Score: 2  NIHSS Free Text : Could not name some high-frequency objects, normal repetition. Not oriented to age or month  Pre-Morbid Modified Rankin Scale: 0 Points = No symptoms at all   Patient/Family was informed the Neurology Consult would occur via TeleHealth consult by way of interactive audio and video telecommunications and consented to receiving care in this manner.   Patient is being evaluated for possible acute neurologic impairment and high probability of imminent or life-threatening deterioration. I spent total of 30 minutes providing care to this patient, including time for face to face visit via telemedicine, review of medical records, imaging studies and discussion of findings with providers, the patient  and/or family.   Dr Ferdie Ping   [facility] [phone]  Case [id number]

## 2020-09-23 ENCOUNTER — Observation Stay: Payer: Medicare Other

## 2020-09-23 ENCOUNTER — Observation Stay
Admit: 2020-09-23 | Discharge: 2020-09-23 | Disposition: A | Discharge status: Home / Self Care | Payer: Medicare Other | Attending: Family Medicine | Admitting: Family Medicine

## 2020-09-23 DIAGNOSIS — R2990 Unspecified symptoms and signs involving the nervous system: Secondary | ICD-10-CM | POA: Diagnosis not present

## 2020-09-23 DIAGNOSIS — R4182 Altered mental status, unspecified: Secondary | ICD-10-CM | POA: Diagnosis not present

## 2020-09-23 DIAGNOSIS — R410 Disorientation, unspecified: Secondary | ICD-10-CM | POA: Diagnosis not present

## 2020-09-23 DIAGNOSIS — R4701 Aphasia: Secondary | ICD-10-CM | POA: Diagnosis not present

## 2020-09-23 LAB — ECHOCARDIOGRAM COMPLETE
AR max vel: 1.81 cm2
AV Area VTI: 1.95 cm2
AV Area mean vel: 2.02 cm2
AV Mean grad: 4 mmHg
AV Peak grad: 8.6 mmHg
Ao pk vel: 1.47 m/s
Area-P 1/2: 4.29 cm2
Height: 71 in
MV VTI: 3.31 cm2
S' Lateral: 3.2 cm
Weight: 3731.95 oz

## 2020-09-23 LAB — LIPID PANEL
Cholesterol: 169 mg/dL (ref 0–200)
HDL: 56 mg/dL (ref >40)
LDL Cholesterol: 103 mg/dL — ABNORMAL HIGH (ref 0–99)
Total CHOL/HDL Ratio: 3 RATIO
Triglycerides: 48 mg/dL (ref <150)
VLDL: 10 mg/dL (ref 0–40)

## 2020-09-23 LAB — URINE DRUG SCREEN, QUALITATIVE (ARMC ONLY)
Amphetamines, Ur Screen: NOT DETECTED
Barbiturates, Ur Screen: NOT DETECTED
Benzodiazepine, Ur Scrn: NOT DETECTED
Cannabinoid 50 Ng, Ur ~~LOC~~: NOT DETECTED
Cocaine Metabolite,Ur ~~LOC~~: NOT DETECTED
MDMA (Ecstasy)Ur Screen: NOT DETECTED
Methadone Scn, Ur: NOT DETECTED
Opiate, Ur Screen: NOT DETECTED
Phencyclidine (PCP) Ur S: NOT DETECTED
Tricyclic, Ur Screen: NOT DETECTED

## 2020-09-23 LAB — AMMONIA: Ammonia: 10 umol/L (ref 9–35)

## 2020-09-23 LAB — CBC
HCT: 38.8 % — ABNORMAL LOW (ref 39.0–52.0)
Hemoglobin: 13 g/dL (ref 13.0–17.0)
MCH: 31.2 pg (ref 26.0–34.0)
MCHC: 33.5 g/dL (ref 30.0–36.0)
MCV: 93 fL (ref 80.0–100.0)
Platelets: 315 10*3/uL (ref 150–400)
RBC: 4.17 MIL/uL — ABNORMAL LOW (ref 4.22–5.81)
RDW: 14.4 % (ref 11.5–15.5)
WBC: 9.8 10*3/uL (ref 4.0–10.5)
nRBC: 0 % (ref 0.0–0.2)

## 2020-09-23 LAB — BASIC METABOLIC PANEL
Anion gap: 9 (ref 5–15)
BUN: 21 mg/dL (ref 8–23)
CO2: 26 mmol/L (ref 22–32)
Calcium: 8.7 mg/dL — ABNORMAL LOW (ref 8.9–10.3)
Chloride: 100 mmol/L (ref 98–111)
Creatinine, Ser: 1.31 mg/dL — ABNORMAL HIGH (ref 0.61–1.24)
GFR, Estimated: 54 mL/min — ABNORMAL LOW (ref >60)
Glucose, Bld: 99 mg/dL (ref 70–99)
Potassium: 3.7 mmol/L (ref 3.5–5.1)
Sodium: 135 mmol/L (ref 135–145)

## 2020-09-23 LAB — HIV ANTIBODY (ROUTINE TESTING W REFLEX): HIV Screen 4th Generation wRfx: NONREACTIVE

## 2020-09-23 LAB — URINALYSIS, MICROSCOPIC (REFLEX): Squamous Epithelial / HPF: NONE SEEN (ref 0–5)

## 2020-09-23 LAB — URINALYSIS, ROUTINE W REFLEX MICROSCOPIC
Bilirubin Urine: NEGATIVE
Glucose, UA: NEGATIVE mg/dL
Ketones, ur: NEGATIVE mg/dL
Leukocytes,Ua: NEGATIVE
Nitrite: NEGATIVE
Protein, ur: NEGATIVE mg/dL
Specific Gravity, Urine: 1.015 (ref 1.005–1.030)
pH: 7.5 (ref 5.0–8.0)

## 2020-09-23 LAB — VITAMIN B12: Vitamin B-12: 377 pg/mL (ref 180–914)

## 2020-09-23 LAB — HEMOGLOBIN A1C
Hgb A1c MFr Bld: 5.7 % — ABNORMAL HIGH (ref 4.8–5.6)
Mean Plasma Glucose: 116.89 mg/dL

## 2020-09-23 LAB — ETHANOL: Alcohol, Ethyl (B): <10 mg/dL (ref <10)

## 2020-09-23 MED ORDER — CLOPIDOGREL BISULFATE 75 MG PO TABS
75.0000 mg | ORAL_TABLET | Freq: Every day | ORAL | Status: DC
  Administered 2020-09-24: 09:00:00 75 mg via ORAL
  Filled 2020-09-23: qty 1

## 2020-09-23 MED ORDER — PHENYLEPHRINE HCL 0.5 % NA SOLN
2.0000 [drp] | NASAL | Status: DC | PRN
  Filled 2020-09-23: qty 15

## 2020-09-23 MED ORDER — ATORVASTATIN CALCIUM 20 MG PO TABS
40.0000 mg | ORAL_TABLET | Freq: Every day | ORAL | Status: DC
  Administered 2020-09-23: 21:00:00 40 mg via ORAL
  Filled 2020-09-23: qty 2

## 2020-09-23 NOTE — ED Notes (Signed)
Patient transported to room 125 via wheelchair by EDT, stable at transfer

## 2020-09-23 NOTE — Procedures (Signed)
Patient Name: Ethan Ayers  MRN: [id number]  Epilepsy Attending: Charlsie Quest  Referring Physician/Provider: Dr Brooke Dare Date: 09/23/2020 Duration: 25.16 mins  Patient history: 80yo M with history of speech impairment, possible right arm tingling. EEG to evaluate for seizure  Level of alertness: Awake, asleep  AEDs during EEG study: None  Technical aspects: This EEG study was done with scalp electrodes positioned according to the 10-20 International system of electrode placement. Electrical activity was acquired at a sampling rate of 500Hz  and reviewed with a high frequency filter of 70Hz  and a low frequency filter of 1Hz . EEG data were recorded continuously and digitally stored.   Description: The posterior dominant rhythm consists of 8Hz  activity of moderate voltage (25-35 uV) seen predominantly in posterior head regions, symmetric and reactive to eye opening and eye closing. Sleep was characterized by vertex waves, sleep spindles (12 to 14 Hz), maximal frontocentral region.  Intermittent left frontotemporal 3 to 5Hz  theta- delta slowing was also noted.  Physiologic photic driving was not seen during photic stimulation.  Hyperventilation was not performed.     ABNORMALITY - Intermittent slow, left frontotemporal region  IMPRESSION: This study is suggestive of nonspecific cortical dysfunction in left frontotemporal region.  No seizures or epileptiform discharges were seen throughout the recording.  Ethan Ayers 

## 2020-09-23 NOTE — Evaluation (Signed)
Physical Therapy Evaluation Patient Details Name: Ethan Ayers MRN: [id number] DOB: November 27, 1940 Today's Date: 09/23/2020   History of Present Illness  80 y.o. Caucasian male with medical history significant for paroxysmal atrial fibrillation, BPH and hypertension, who presented to the ER with acute onset of altered mental status with confusion as well as expressive dysphasia with dysarthria that started today prior to coming to the ER.  He denied any paresthesias or focal muscle weakness.  No headache or dizziness or blurred vision.  He denied any chest pain or palpitations.  He is usually alert and oriented x3 and during the interview is not oriented to time.  No fever or chills.  No chest pain or palpitations.  No bleeding diathesis.  He was recently treated for prostatitis.  Clinical Impression  Pt with expressive difficulties and and per wife he continues to be very far from his baseline.  He is physically at his baseline with some limp/knee bent gait, but able to ambulate 250 ft and negotiate steps w/o AD or assist.  We did discuss potentially using cane and/or outpt PT per progress but wife agrees this can be done as an ad lib with PCP if needed as pt is still able to stay active and seems near baseline from a gait/mobility stand point.  Pt is safe to return home, PT will sign off at this time.     Follow Up Recommendations No PT follow up    Equipment Recommendations  None recommended by PT    Recommendations for Other Services       Precautions / Restrictions Precautions Precautions: Fall Restrictions Weight Bearing Restrictions: No      Mobility  Bed Mobility Overal bed mobility: Independent             General bed mobility comments: Pt easily gets to EOB w/o assist once cued in to what we were trying to do    Transfers Overall transfer level: Modified independent Equipment used: None             General transfer comment: Pt was able to rise to standing w/o  assist or excessive UE use, no LOBs or unsteadiness  Ambulation/Gait Ambulation/Gait assistance: Modified independent (Device/Increase time) Gait Distance (Feet): 250 Feet Assistive device: None       General Gait Details: Per wife pt is at/near baseline.  Pt did have mild limp and lacked TKE b/l, but was able to maintain consistent cadence w/o LOBs or fatigue.  Vitals remained stable.  Stairs Stairs: Yes Stairs assistance: Modified independent (Device/Increase time) Stair Management: One rail Left Number of Stairs: 6 General stair comments: Pt was able to negotiate up/down steps with reciprocal strategy and good confidence.  Able to control ascent/decent with UE/single rail  Wheelchair Mobility    Modified Rankin (Stroke Patients Only)       Balance Overall balance assessment: Modified Independent (no falls in last 6 months)                                           Pertinent Vitals/Pain Pain Assessment: No/denies pain    Home Living Family/patient expects to be discharged to:: Private residence Living Arrangements: Spouse/significant other Available Help at Discharge: Family;Available 24 hours/day (daughter coming in from out of town at this time) Type of Home: House Home Access: Stairs to enter Entrance Stairs-Rails: Left Entrance Stairs-Number of Steps: 3 Home  Layout: Two level;Bed/bath upstairs Home Equipment: Walker - 2 wheels;Cane - single point      Prior Function Level of Independence: Independent         Comments: Pt apparently walks around the block almost daily, has had less total activity since back pain started in July     Hand Dominance        Extremity/Trunk Assessment   Upper Extremity Assessment Upper Extremity Assessment: Overall WFL for tasks assessed    Lower Extremity Assessment Lower Extremity Assessment: Overall WFL for tasks assessed       Communication   Communication: Expressive difficulties (different  from baseline)  Cognition Arousal/Alertness: Awake/alert Behavior During Therapy: WFL for tasks assessed/performed;Flat affect Overall Cognitive Status: History of cognitive impairments - at baseline                                 General Comments: Pt struggled to voice date, location, etc - he was oriented to himself but per wife is much less alert and interactive than he was even 24 hrs      General Comments      Exercises     Assessment/Plan    PT Assessment    PT Problem List         PT Treatment Interventions      PT Goals (Current goals can be found in the Care Plan section)  Acute Rehab PT Goals Patient Stated Goal: go home PT Goal Formulation: All assessment and education complete, DC therapy    Frequency     Barriers to discharge        Co-evaluation               AM-PAC PT "6 Clicks" Mobility  Outcome Measure                  End of Session              Time: 8502-7741 PT Time Calculation (min) (ACUTE ONLY): 24 min   Charges:   PT Evaluation $PT Eval Low Complexity: 1 Low PT Treatments $Gait Training: 8-22 mins        Malachi Pro, DPT 09/23/2020, 9:27 AM

## 2020-09-23 NOTE — H&P (View-Only) (Signed)
Full, formal consult note to follow. In short, patient presented with right-sided dysarthria and some degree of aphasia as well as confusion.  MRI was negative for acute stroke, but his symptoms lasted the better part of the day.  Likely a significant TIA.  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  Seen by neurology and they appropriately recommended we be consulted for evaluation for carotid intervention.  I had a long discussion with the patient and the family.  Would recommend the initiation of Plavix and this can be done in addition to Xarelto.  We will plan a carotid angiogram with possible intervention for Monday, September 12.  This will allow him to be on Plavix for 5 days prior to the procedure.  Have discussed that if appropriate anatomy for carotid stenting is identified with significant stenosis, this will be performed at that time and he will stay in the hospital 1 night.  He can be discharged home tomorrow if he is otherwise medically stable and return as an outpatient for this procedure on Monday. 

## 2020-09-23 NOTE — Progress Notes (Signed)
Eeg done 

## 2020-09-23 NOTE — ED Notes (Signed)
Dr. Mansy at the bedside. 

## 2020-09-23 NOTE — Progress Notes (Signed)
Patient to EEG in stable condition. 

## 2020-09-23 NOTE — Evaluation (Signed)
Occupational Therapy Evaluation Patient Details Name: MINOR IDEN MRN: [id number] DOB: 27-Dec-1940 Today's Date: 09/23/2020    History of Present Illness 80 y.o. Caucasian male with medical history significant for paroxysmal atrial fibrillation, BPH and hypertension, who presented to the ER with acute onset of altered mental status with confusion as well as expressive dysphasia with dysarthria that started today prior to coming to the ER.  He denied any paresthesias or focal muscle weakness.  No headache or dizziness or blurred vision.  He denied any chest pain or palpitations.  He is usually alert and oriented x3 and during the interview is not oriented to time.  No fever or chills.  No chest pain or palpitations.  No bleeding diathesis.  He was recently treated 3 prostatitis.   Clinical Impression   Upon entering the room, pt supine in bed with wife and daughter present. Pt is agreeable to OT intervention. He has urgency for toileting and stands to transfer to [facility] at mod I level without physical assistance. Pt standing from commode at sink and performs hand hygiene, combs hair, and brushes teeth without any assistance. Pt has difficulty with word finding and when given choice of 2 still answers incorrectly 75% of the time. Pt given BIMS and able to recall 3/3 items immediately and after several minutes able to recall 2/3 items with verbal guidance cuing. Family reports cognition and expressive difficulties are different from his baseline. Pt lives at home with wife independently at baseline. Pt has no skilled OT needs at this time. OT does request SLP consult to address cognition and speech concerns. OT to SIGN OFF.     Follow Up Recommendations  No OT follow up;Supervision - Intermittent    Equipment Recommendations  None recommended by OT    Recommendations for Other Services Speech consult     Precautions / Restrictions Precautions Precautions: Fall Restrictions Weight Bearing  Restrictions: No      Mobility Bed Mobility Overal bed mobility: Independent             General bed mobility comments: no physical assistance needed to EOB    Transfers Overall transfer level: Modified independent Equipment used: None             General transfer comment: Pt was able to rise to standing w/o assist or excessive UE use, no LOBs or unsteadiness    Balance Overall balance assessment: Modified Independent                                         ADL either performed or assessed with clinical judgement   ADL Overall ADL's : Needs assistance/impaired     Grooming: Wash/dry hands;Oral care;Modified independent;Brushing hair                   Toilet Transfer: Modified Independent;BSC;Ambulation   Toileting- Clothing Manipulation and Hygiene: Modified independent       Functional mobility during ADLs: Modified independent       Vision Patient Visual Report: No change from baseline       Perception     Praxis      Pertinent Vitals/Pain Pain Assessment: No/denies pain     Hand Dominance Right   Extremity/Trunk Assessment Upper Extremity Assessment Upper Extremity Assessment: Overall WFL for tasks assessed   Lower Extremity Assessment Lower Extremity Assessment: Overall WFL for tasks assessed  Communication Communication Communication: Expressive difficulties (word finding)   Cognition Arousal/Alertness: Awake/alert Behavior During Therapy: WFL for tasks assessed/performed;Flat affect Overall Cognitive Status: Impaired/Different from baseline                                 General Comments: Family present in room very surprised by how pt answered orientation questions. Even when given choice of 2 he is unable to correctly answer. Oriented to self and location.              Home Living Family/patient expects to be discharged to:: Private residence Living Arrangements:  Spouse/significant other Available Help at Discharge: Family;Available 24 hours/day Type of Home: House Home Access: Stairs to enter [employer] of Steps: 3 Entrance Stairs-Rails: Left Home Layout: Two level;Bed/bath upstairs Alternate Level Stairs-Number of Steps: 13 Alternate Level Stairs-Rails: Left           Home Equipment: Walker - 2 wheels;Cane - single point          Prior Functioning/Environment Level of Independence: Independent        Comments: Pt is very active at baseline.                 OT Goals(Current goals can be found in the care plan section) Acute Rehab OT Goals Patient Stated Goal: go home OT Goal Formulation: With patient/family Time For Goal Achievement: 09/23/20 Potential to Achieve Goals: Good   AM-PAC OT "6 Clicks" Daily Activity     Outcome Measure Help from another person eating meals?: None Help from another person taking care of personal grooming?: None Help from another person toileting, which includes using toliet, bedpan, or urinal?: None Help from another person bathing (including washing, rinsing, drying)?: None Help from another person to put on and taking off regular upper body clothing?: None Help from another person to put on and taking off regular lower body clothing?: None 6 Click Score: 24   End of Session Nurse Communication: Mobility status;Other (comment) (expressive difficulties)  Activity Tolerance: Patient tolerated treatment well Patient left: in bed;with call bell/phone within reach;with family/visitor present  OT Visit Diagnosis: Unsteadiness on feet (R26.81);Muscle weakness (generalized) (M62.81)                Time: 9323-5573 OT Time Calculation (min): 21 min Charges:  OT General Charges $OT Visit: 1 Visit OT Evaluation $OT Eval Low Complexity: 1 Low OT Treatments $Self Care/Home Management : 8-22 mins Jackquline Denmark, MS, OTR/L , CBIS ascom [phone]  09/23/20, 12:29 PM

## 2020-09-23 NOTE — Consult Note (Signed)
Neurology Consultation Reason for Consult: Recurrent episodes of confusion and speech difficulties Requesting Physician: Valente David  CC: Disorientation and speech impairment   History is obtained from: Patient, wife and daughter at bedside and chart review  HPI: Ethan Ayers is a 80 y.o. male with a past medical history significant for atrial fibrillation on Xarelto, hypertension, BPH  He was evaluated overnight by teleneurology, noted to have a last known well at 1745 with mental status changed (with persistent disorientation resulting in NIH stroke scale of 2), slurred speech which had resolved, generalized weakness, and right arm tingling.  He additionally did have some difficulty naming high-frequency objects which was not scored on the NIH scale  Family reports that he was his normal self until 6 PM when he was to say Delorise Shiner at the family dinner table and was unable to do so.  He was still quite disoriented on arrival to the ED although overall he was improving.  He has continued to improve over the course of his hospital stay but is still somewhat slower in his speech and responses than his baseline.  At baseline his family does say it would not be unusual for him to be disoriented to year and even months since he retired back in 2006.  He is still driving but his wife has been noticing that he tends to rely more on her for directions although he has not experienced getting lost while driving on his own at this time.  Additionally about a year ago his wife took over finances when they were having some computer upgrade issues and she was concerned about his memory, although there were no missed bill payments.  On a daily basis he watches [employer], reads books on his [place] and takes daily walks.  Although initial notes reported these episodes have happened before, and family denies this to me today.  He was recently treated for prostatitis with a course of ciprofloxacin (09/08/2020, 2-week course  scheduled to end 09/22/2020 -- wife reports last dose was to the be this morning), at the time his renal function was noted to be slightly worsened with a creatinine of 1.5 from his baseline of 1.3 and he had a mild leukocytosis to 10.4.  He was also started on Flomax at that time.  Otherwise on review of systems he has been having some nosebleeds since July 8 were quite impressive lasting 7 to 8 hours and requiring cauterization about 2 weeks ago.  This did improve the nosebleeds but recently the scab came off and he has been having recurrent, though not as severe bleeds, which is planned for repeat cauterization today  LKW: 5:45 PM on 9/6 tPA given?: No, due to on anticoagulation Premorbid modified rankin scale: 0-1     0 - No symptoms.     1 - No significant disability. Able to carry out all usual activities, despite some symptoms.  ROS: Unable to obtain reliably due to altered mental status, but patient and family deny any other symptoms at this time.   No past medical history on file. See pertinent in HPI above  Surgical history per [facility] records: TONSILLECTOMY AND ADENOIDECTOMY   Current Outpatient Medications  Medication Instructions   diltiazem (CARDIZEM CD) 120 mg, Oral, Daily   hydrocortisone 1 % ointment 1 application, Topical, 2 times daily   hydrOXYzine (ATARAX/VISTARIL) 25 mg, Oral, 3 times daily PRN   rivaroxaban (XARELTO) 20 mg, Oral, Daily with supper   tamsulosin (FLOMAX) 0.4 mg, Oral, Daily  Current Facility-Administered Medications:    0.9 %  sodium chloride infusion, , Intravenous, Continuous, Mansy, Jan A, MD, Last Rate: 75 mL/hr at 09/23/20 0451, Infusion Verify at 09/23/20 0451   acetaminophen (TYLENOL) tablet 650 mg, 650 mg, Oral, Q6H PRN **OR** acetaminophen (TYLENOL) suppository 650 mg, 650 mg, Rectal, Q6H PRN, Mansy, Jan A, MD   aspirin EC tablet 81 mg, 81 mg, Oral, Daily, Mansy, Jan A, MD, 81 mg at 09/23/20 0830   diltiazem (CARDIZEM CD) 24 hr capsule 120 mg,  120 mg, Oral, Daily, Mansy, Jan A, MD, 120 mg at 09/23/20 0830   magnesium hydroxide (MILK OF MAGNESIA) suspension 30 mL, 30 mL, Oral, Daily PRN, Mansy, Jan A, MD   ondansetron (ZOFRAN) tablet 4 mg, 4 mg, Oral, Q6H PRN **OR** ondansetron (ZOFRAN) injection 4 mg, 4 mg, Intravenous, Q6H PRN, Mansy, Jan A, MD   rivaroxaban (XARELTO) tablet 20 mg, 20 mg, Oral, Q supper, Mansy, Jan A, MD   tamsulosin [facility]) capsule 0.4 mg, 0.4 mg, Oral, Daily, Mansy, Jan A, MD, 0.4 mg at 09/23/20 0830   traZODone (DESYREL) tablet 25 mg, 25 mg, Oral, QHS PRN, Mansy, Vernetta Honey, MD   No family history on file.  In our EMR Per [facility] records:  Stroke Mother   Coronary Artery Disease (Blocked arteries around heart) Father   Heart disease Sister   Social History:  reports that he has never smoked. He has never used smokeless tobacco. He reports that he does not drink alcohol and does not use drugs.  Per [facility] records, former smoker 20-pack-year history quit in 1974, and drinks 14 shots of liquor per week   Exam: Current vital signs: BP (!) 146/88 (BP Location: Right Arm)   Pulse 90   Temp 98.4 F (36.9 C) (Oral)   Resp 18   Ht 5\' 11"  (1.803 m)   Wt 105.8 kg   SpO2 99%   BMI 32.53 kg/m  Vital signs in last 24 hours: Temp:  [98 F (36.7 C)-99.3 F (37.4 C)] 98.4 F (36.9 C) (09/07 0815) Pulse Rate:  [73-99] 90 (09/07 0815) Resp:  [17-21] 18 (09/07 0815) BP: (129-159)/(88-108) 146/88 (09/07 0815) SpO2:  [94 %-100 %] 99 % (09/07 0815) Weight:  [105.8 kg] 105.8 kg (09/06 2111)   Physical Exam  Constitutional: Appears well-developed and well-nourished.  Psych: Affect appropriate to situation, calm and cooperative Eyes: No scleral injection HENT: No oropharyngeal obstruction.  MSK: no joint deformities.  Cardiovascular: Normal rate and regular rhythm.  Respiratory: Effort normal, non-labored breathing GI: Soft.  No distension. There is no tenderness.  Skin: Warm dry and intact visible  skin  Neuro: Mental Status: Patient is awake, alert, oriented to person, place, but reports it is January and reports he does not know the year Patient is able to give a clear and coherent history. No signs of aphasia or neglect Cranial Nerves: II: Visual Fields are full. Pupils are equal, round, and reactive to light.   III,IV, VI: EOMI without ptosis or diploplia.  V: Facial sensation is symmetric to temperature VII: Facial movement is symmetric.  VIII: hearing is intact to voice X: Uvula elevates symmetrically XI: Shoulder shrug is symmetric. XII: tongue is midline without atrophy or fasciculations.  Motor: Bulk is normal. 5/5 strength was present in all four extremities.  He does have a slight upward drift of the right upper extremity on drift testing and mild spatial apraxia on strength testing Sensory: Sensation is symmetric to light touch and temperature in the  arms and legs.  He denies any length dependent loss of temperature sensation Deep Tendon Reflexes: 2+ and symmetric in the biceps and patellae.  Cerebellar: FNF and HKS are intact bilaterally, though he does have a mild end reach tremor in the left upper extremity  NIHSS total 2 Score breakdown: 2 points for being unable to correctly state age/month  I have reviewed labs in epic and the results pertinent to this consultation are: Creatinine 1.39 with a GFR 51 which appears to be near his baseline, CBC notable for borderline anemia at 12.9 PT 35.6 INR 3.6 PTT 45  Hemoglobin A1c 5.7% Lipid panel notable for LDL 103 Ethanol level undetectable  UA/UDS ordered, but not yet collected, have discussed with bedside nursing to collect today UA negative, UDS negative    I have reviewed the images obtained: MRI brain personally reviewed with chronic microhemorrhage in the left parietal lobe, chronic microvascular changes, no acute abnormality MRA personally reviewed with 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 No acute intracranial abnormality. 2. Evidence of poor flow versus occlusion of the distal right vertebral artery, see Intracranial MRA reported separately. 3. Moderate for age signal changes in the cerebral white matter with a solitary chronic microhemorrhage in the left hemisphere. Favor chronic small vessel disease. MRA: 1. Severely stenotic distal Right Vertebral Artery, although it might remain patent to the Basilar. 2. Other anterior and posterior circulation atherosclerosis, but with generally mild stenoses. Up to moderate stenosis of the right PCA P2 segment.  Carotid ultrasound:  Right: Color duplex indicates moderate heterogeneous plaque with no hemodynamically significant stenosis by duplex criteria in the extracranial cerebrovascular circulation.  Left:  Heterogeneous plaque at the left carotid bifurcation contributes to 50%-69% stenosis by established duplex criteria.  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  Notably the patient's MRI was negative but he has been slow to clear.  This could reflect underlying mild dementia/mild cognitive impairment, but will additionally evaluate for the possibility of focal seizure with EEG  Recommendations:  #Transient speech difficulty, slow to clear, concerning for TIA versus focal seizure -Atorvastatin 40 mg nightly for goal LDL 70 (currently 103 not on medication) -- ordered -Continue Xarelto for atrial fibrillation -Defer addition of aspirin on top of Xarelto to vascular surgery pending their evaluation for potential intervention  #Slow to clear altered mental status with some baseline gradual decline -B12, MMA, thiamine, ammonia, RPR, HIV for initial reversible  causes workup -Routine EEG -Would avoid fluoroquinolone use in the future in case this contributed to delirium -Consider outpatient neurocognitive evaluation -Patient prefers outpatient follow-up with [facility] neurology, was given number to call for appointment [phone]  Brooke Dare MD-PhD [facility] [phone] [facility] coverage for [facility] is from 8 AM to 4 AM in-house and 4 PM to 8 PM by telephone/video. 8 PM to 8 AM emergent questions or overnight urgent questions should be addressed to Teleneurology On-call or [facility] neurohospitalist; contact information can be found on AMION

## 2020-09-23 NOTE — Progress Notes (Signed)
*  PRELIMINARY RESULTS* Echocardiogram 2D Echocardiogram has been performed.  Joanette Gula Twanisha Foulk 09/23/2020, 9:52 AM

## 2020-09-23 NOTE — Progress Notes (Signed)
Patient returned from EEG in stable condition. °

## 2020-09-23 NOTE — TOC Progression Note (Signed)
Transition of Care [facility]) - Progression Note    Patient Details  Name: Ethan Ayers MRN: [id number] Date of Birth: 23-Apr-1940  [facility] CM/SW Contact  Caryn Section, RN Phone Number: 09/23/2020, 9:04 AM  Clinical Narrative:   RNCM spoke to patient and spouse.  Patient lives at home with spouse.  No concerns about transportation to appointments or to the pharmacy.  Patient is able to take medications as prescribed.    Patient currently has no home health services, does not anticipate need for these.  Spouse can assist with care as appropriate.  Both have no concerns about patient returning home after discharge.  TOC contact information given.         Expected Discharge Plan and Services                                                 Social Determinants of Health (SDOH) Interventions    Readmission Risk Interventions No flowsheet data found.

## 2020-09-23 NOTE — Consult Note (Signed)
Full, formal consult note to follow. In short, patient presented with right-sided dysarthria and some degree of aphasia as well as confusion.  MRI was negative for acute stroke, but his symptoms lasted the better part of the day.  Likely a significant TIA.  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  Seen by neurology and they appropriately recommended we be consulted for evaluation for carotid intervention.  I had a long discussion with the patient and the family.  Would recommend the initiation of Plavix and this can be done in addition to Xarelto.  We will plan a carotid angiogram with possible intervention for Monday, September 12.  This will allow him to be on Plavix for 5 days prior to the procedure.  Have discussed that if appropriate anatomy for carotid stenting is identified with significant stenosis, this will be performed at that time and he will stay in the hospital 1 night.  He can be discharged home tomorrow if he is otherwise medically stable and return as an outpatient for this procedure on Monday.

## 2020-09-23 NOTE — Progress Notes (Signed)
PROGRESS NOTE    Ethan Ayers  ZOX:[id number] DOB: 09-19-1940 DOA: 09/22/2020 PCP: Danella Penton, MD    Brief Narrative:  [id number] with history significant for paroxysmal atrial fibrillation, BPH and hypertension, who presented to the ER with acute onset of altered mental status with confusion as well as expressive dysphasia with dysarthria  Assessment & Plan:   Active Problems:   Altered mental status   1.  Altered mental status with confusion with associated expressive dysphagia and dysarthria -Patient reports symptoms appear much improved today, more oriented -MRI and MRA brain reviewed.  Evidence of severely stenotic distal right vertebral artery with poor flow -Carotid Dopplers notable for heterogeneous plaque at the left carotid bifurcation contributing to 50 to 69% stenosis -Case discussed with neurology who recommends vascular surgery consultation -Have consulted vascular surgery for recommendations -We will continue Xarelto, follow-up EEG per neurology recommendations -No PT or OT needs identified  2.  BPH. -Continue Flomax as tolerated  3.  Chronic atrial fibrillation with controlled ventricular response. - We will continue his Xarelto and Cardizem CD as per above  4.  Essential hypertension. - We will continue his Cardizem CD. -Blood pressure trends stable at present   DVT prophylaxis: Xarelto Code Status: Full code Family Communication: Patient in room, family at bedside  Status is: Observation  The patient remains OBS appropriate and will d/c before 2 midnights.  Dispo: The patient is from: Home              Anticipated d/c is to: Home              Patient currently is not medically stable to d/c.   Difficult to place patient No       Consultants:  Neurology Vascular surgery  Procedures:    Antimicrobials: Anti-infectives (From admission, onward)    None       Subjective: Reports feeling much better today  Objective: Vitals:   09/23/20  0030 09/23/20 0353 09/23/20 0815 09/23/20 1217  BP: (!) 159/108 (!) 157/92 (!) 146/88 (!) 145/85  Pulse: 99 73 90 80  Resp:  Temp: 98.4 F (36.9 C) 99.3 F (37.4 C) 98.4 F (36.9 C) 98.9 F (37.2 C)  TempSrc: Oral  Oral Oral  SpO2: 100% 99% 99% 98%  Weight:      Height:        Intake/Output Summary (Last 24 hours) at 09/23/2020 1526 Last data filed at 09/23/2020 0451 Gross per 24 hour  Intake 29.9 ml  Output --  Net 29.9 ml   Filed Weights   09/22/20 2111  Weight: 105.8 kg    Examination: General exam: Awake, laying in bed, in nad Respiratory system: Normal respiratory effort, no wheezing Cardiovascular system: regular rate, s1, s2 Gastrointestinal system: Soft, nondistended, positive BS Central nervous system: CN2-12 grossly intact, strength intact Extremities: Perfused, no clubbing Skin: Normal skin turgor, no notable skin lesions seen Psychiatry: Mood normal // no visual hallucinations   Data Reviewed: I have personally reviewed following labs and imaging studies  CBC: Recent Labs  Lab 09/22/20 2117 09/23/20 0140  WBC 9.4 9.8  NEUTROABS 5.9  --   HGB 12.9* 13.0  HCT 36.3* 38.8*  MCV 93.1 93.0  PLT 295 315   Basic Metabolic Panel: Recent Labs  Lab 09/22/20 2117 09/23/20 0140  NA 135 135  K 3.6 3.7  CL 101 100  CO2 26 26  GLUCOSE 102* 99  BUN 22 21  CREATININE 1.39* 1.31*  CALCIUM 8.8* 8.7*   GFR: Estimated Creatinine Clearance: 53.8 mL/min (A) (by C-G formula based on SCr of 1.31 mg/dL (H)). Liver Function Tests: Recent Labs  Lab 09/22/20 2117  AST 25  ALT 19  ALKPHOS 68  BILITOT 0.5  PROT 6.7  ALBUMIN 3.5   No results for input(s): LIPASE, AMYLASE in the last 168 hours. Recent Labs  Lab 09/23/20 1009  AMMONIA 10   Coagulation Profile: Recent Labs  Lab 09/22/20 2117  INR 3.6*   Cardiac Enzymes: No results for input(s): CKTOTAL, CKMB, CKMBINDEX, TROPONINI in the last 168 hours. BNP (last 3 results) No results for  input(s): PROBNP in the last 8760 hours. HbA1C: Recent Labs    09/23/20 0140  HGBA1C 5.7*   CBG: Recent Labs  Lab 09/22/20 2116  GLUCAP 98   Lipid Profile: Recent Labs    09/23/20 0140  CHOL 169  HDL 56  LDLCALC 103*  TRIG 48  CHOLHDL 3.0   Thyroid Function Tests: No results for input(s): TSH, T4TOTAL, FREET4, T3FREE, THYROIDAB in the last 72 hours. Anemia Panel: Recent Labs    09/23/20 1009  VITAMINB12 377   Sepsis Labs: No results for input(s): PROCALCITON, LATICACIDVEN in the last 168 hours.  Recent Results (from the past 240 hour(s))  Resp Panel by RT-PCR (Flu A&B, Covid) Nasopharyngeal Swab     Status: None   Collection Time: 09/22/20  9:17 PM   Specimen: Nasopharyngeal Swab; Nasopharyngeal(NP) swabs in vial transport medium  Result Value Ref Range Status   SARS Coronavirus 2 by RT PCR NEGATIVE NEGATIVE Final    Comment: (NOTE) SARS-CoV-2 target nucleic acids are NOT DETECTED.  The SARS-CoV-2 RNA is generally detectable in upper respiratory specimens during the acute phase of infection. The lowest concentration of SARS-CoV-2 viral copies this assay can detect is 138 copies/mL. A negative result does not preclude SARS-Cov-2 infection and should not be used as the sole basis for treatment or other patient management decisions. A negative result may occur with  improper specimen collection/handling, submission of specimen other than nasopharyngeal swab, presence of viral mutation(s) within the areas targeted by this assay, and inadequate number of viral copies(<138 copies/mL). A negative result must be combined with clinical observations, patient history, and epidemiological information. The expected result is Negative.  Fact Sheet for Patients:  BloggerCourse.comhttps://www.fda.gov/media/152166/download  Fact Sheet for Healthcare Providers:  SeriousBroker.ithttps://www.fda.gov/media/152162/download  This test is no t yet approved or cleared by the [place] FDA and  has been  authorized for detection and/or diagnosis of SARS-CoV-2 by FDA under an Emergency Use Authorization (EUA). This EUA will remain  in effect (meaning this test can be used) for the duration of the COVID-19 declaration under Section 564(b)(1) of the Act, 21 U.S.C.section 360bbb-3(b)(1), unless the authorization is terminated  or revoked sooner.       Influenza A by PCR NEGATIVE NEGATIVE Final   Influenza B by PCR NEGATIVE NEGATIVE Final    Comment: (NOTE) The Xpert Xpress SARS-CoV-2/FLU/RSV plus assay is intended as an aid in the diagnosis of influenza from Nasopharyngeal swab specimens and should not be used as a sole basis for treatment. Nasal washings and aspirates are unacceptable for Xpert Xpress SARS-CoV-2/FLU/RSV testing.  Fact Sheet for Patients: BloggerCourse.comhttps://www.fda.gov/media/152166/download  Fact Sheet for Healthcare Providers: SeriousBroker.ithttps://www.fda.gov/media/152162/download  This test is not yet approved or cleared by the [place] FDA and has been authorized for detection and/or diagnosis of SARS-CoV-2 by FDA under an Emergency Use Authorization (EUA). This EUA will remain in effect (  meaning this test can be used) for the duration of the COVID-19 declaration under Section 564(b)(1) of the Act, 21 U.S.C. section 360bbb-3(b)(1), unless the authorization is terminated or revoked.  Performed at [facility], [address] [place], [place] [address]      Radiology Studies: MR ANGIO HEAD WO CONTRAST  Result Date: 09/23/2020 CLINICAL DATA:  80 year old male code stroke presentation. Altered mental status and slurred speech. EXAM: MRA HEAD WITHOUT CONTRAST TECHNIQUE: Angiographic images of the Circle of Willis were acquired using MRA technique without intravenous contrast. COMPARISON:  Brain MRI today. FINDINGS: Antegrade flow in the distal left vertebral artery without stenosis to the vertebrobasilar junction. Left PICA origin is normal. However, severely attenuated flow  signal in the distal right vertebral artery which appears highly stenotic although might remain patent (series 1036, image 11). Basilar artery is patent with mild irregularity but no significant stenosis. Bilateral AICA, SCA, and PCA origins are patent. Posterior communicating arteries are diminutive or absent. There is mild irregularity and stenosis at the left PCA origin. Other left PCA branches are within normal limits. There is mild to moderate right PCA P2 irregularity and stenosis. Other right PCA branches are within normal limits. Antegrade flow in both ICA siphons. Tortuous distal cervical left ICA. Mild to moderate cavernous and supraclinoid ICA irregularity in keeping with atherosclerosis, but only mild right ICA supraclinoid stenosis occurs. Ophthalmic artery origins are within normal limits. Patent carotid termini, MCA and ACA origins. Mild irregularity at the right ACA origin without definite stenosis. Anterior communicating artery appears normal. Visible ACA branches are within normal limits. Left MCA M1 segment bifurcates early without stenosis. Visible left MCA branches are within normal limits. Right MCA M1 segment also bifurcates early with mild irregularity but no significant stenosis. Visible right MCA branches appear patent with mild irregularity. IMPRESSION: 1. Severely stenotic distal Right Vertebral Artery, although it might remain patent to the Basilar. 2. Other anterior and posterior circulation atherosclerosis, but with generally mild stenoses. Up to moderate stenosis of the right PCA P2 segment. Electronically Signed   By: Odessa Fleming M.D.   On: 09/23/2020 05:03   MR BRAIN WO CONTRAST  Result Date: 09/23/2020 CLINICAL DATA:  80 year old male code stroke presentation. Altered mental status and slurred speech. EXAM: MRI HEAD WITHOUT CONTRAST TECHNIQUE: Multiplanar, multiecho pulse sequences of the brain and surrounding structures were obtained without intravenous contrast. COMPARISON:  Plain  head CT 09/22/2020. FINDINGS: Brain: No restricted diffusion to suggest acute infarction. No midline shift, mass effect, evidence of mass lesion, ventriculomegaly, extra-axial collection or acute intracranial hemorrhage. Cervicomedullary junction and pituitary are within normal limits. Patchy and confluent bilateral cerebral white matter T2 and FLAIR hyperintensity. Solitary chronic microhemorrhage in the left lower parietal lobe series 13, image 36. No cortical encephalomalacia or other chronic cerebral blood products. Deep gray matter nuclei appear within normal limits. There is mild T2 heterogeneity in the pons. Cerebellum is within normal limits. Vascular: Distal right vertebral artery flow void is lost on series 10, image 5. But the other major intracranial vascular flow voids are preserved. See intracranial MRA reported separately. Skull and upper cervical spine: Negative. Normal bone marrow signal. Sinuses/Orbits: Postoperative changes to both globes, otherwise negative orbits. Paranasal sinuses and mastoids are stable and well aerated. Other: Visible internal auditory structures appear normal. Negative visible scalp and face. IMPRESSION: 1. No acute intracranial abnormality. 2. Evidence of poor flow versus occlusion of the distal right vertebral artery, see Intracranial MRA reported separately. 3. Moderate for  age signal changes in the cerebral white matter with a solitary chronic microhemorrhage in the left hemisphere. Favor chronic small vessel disease. Electronically Signed   By: Odessa Fleming M.D.   On: 09/23/2020 04:57   US Carotid Bilateral  Result Date: 09/23/2020 CLINICAL DATA:  80 year old male with a history of stroke symptoms EXAM: BILATERAL CAROTID DUPLEX ULTRASOUND TECHNIQUE: Wallace Cullens scale imaging, color Doppler and duplex ultrasound were performed of bilateral carotid and vertebral arteries in the neck. COMPARISON:  None. FINDINGS: Criteria: Quantification of carotid stenosis is based on velocity  parameters that correlate the residual internal carotid diameter with NASCET-based stenosis levels, using the diameter of the distal internal carotid lumen as the denominator for stenosis measurement. The following velocity measurements were obtained: RIGHT ICA:  Systolic 71 cm/sec, Diastolic 21 cm/sec CCA:  64 cm/sec SYSTOLIC ICA/CCA RATIO:  1.1 ECA:  73 cm/sec LEFT ICA:  Systolic 140 cm/sec, Diastolic 39 cm/sec CCA:  62 cm/sec SYSTOLIC ICA/CCA RATIO:  2.3 ECA:  91 cm/sec Right Brachial SBP: Not acquired Left Brachial SBP: Not acquired RIGHT CAROTID ARTERY: No significant calcified disease of the right common carotid artery. Intermediate waveform maintained. Heterogeneous plaque without significant calcifications at the right carotid bifurcation. Low resistance waveform of the right ICA. No significant tortuosity. RIGHT VERTEBRAL ARTERY: Antegrade flow with low resistance waveform. LEFT CAROTID ARTERY: No significant calcified disease of the left common carotid artery. Intermediate waveform maintained. Heterogeneous plaque at the left carotid bifurcation without significant calcifications. Low resistance waveform of the left ICA. LEFT VERTEBRAL ARTERY:  Antegrade flow with low resistance waveform. IMPRESSION: Right: Color duplex indicates moderate heterogeneous plaque with no hemodynamically significant stenosis by duplex criteria in the extracranial cerebrovascular circulation. Left: Heterogeneous plaque at the left carotid bifurcation contributes to 50%-69% stenosis by established duplex criteria. Signed, Yvone Neu. Reyne Dumas, RPVI [facility] Electronically Signed   By: Gilmer Mor D.O.   On: 09/23/2020 12:13   EEG adult  Result Date: 09/23/2020 Charlsie Quest, MD     09/23/2020  3:22 PM Patient Name: HUMZAH HARTY MRN: [id number] Epilepsy Attending: Charlsie Quest Referring Physician/Provider: Dr Brooke Dare Date: 09/23/2020 Duration: 25.16 mins  Patient history: 80yo M with history of speech impairment, possible right arm tingling. EEG to evaluate for seizure Level of alertness: Awake, asleep AEDs during EEG study: None Technical aspects: This EEG study was done with scalp electrodes positioned according to the 10-20 International system of electrode placement. Electrical activity was acquired at a sampling rate of 500Hz  and reviewed with a high frequency filter of 70Hz  and a low frequency filter of 1Hz . EEG data were recorded continuously and digitally stored. Description: The posterior dominant rhythm consists of 8Hz  activity of moderate voltage (25-35 uV) seen predominantly in posterior head regions, symmetric and reactive to eye opening and eye closing. Sleep was characterized by vertex waves, sleep spindles (12 to 14 Hz), maximal frontocentral region.  Intermittent left frontotemporal 3 to 5Hz  theta- delta slowing was also noted.  Physiologic photic driving was not seen during photic stimulation.  Hyperventilation was not performed.   ABNORMALITY - Intermittent slow, left frontotemporal region IMPRESSION: This study is suggestive of nonspecific cortical dysfunction in left frontotemporal region.  No seizures or epileptiform discharges were seen throughout the recording.   CT HEAD CODE STROKE WO CONTRAST  Result Date: 09/22/2020 CLINICAL DATA:  Code stroke. Initial evaluation for altered mental status, slurred speech. EXAM: CT HEAD WITHOUT CONTRAST TECHNIQUE: Contiguous axial  images were obtained from the base of the skull through the vertex without intravenous contrast. COMPARISON:  None available. FINDINGS: Brain: Moderately advanced age-related cerebral atrophy with chronic small vessel ischemic disease. No acute intracranial hemorrhage. No visible acute large vessel territory infarct. No mass lesion, midline shift or mass effect. No hydrocephalus or extra-axial fluid collection. Vascular: No convincing hyperdense vessel.  Scattered vascular calcifications noted within the carotid siphons. Skull: Scalp soft tissues and calvarium within normal limits. Sinuses/Orbits: Globes and orbital soft tissues demonstrate no acute finding. Mild scattered mucosal thickening noted within the ethmoidal air cells and maxillary sinuses. Paranasal sinuses are otherwise clear. No mastoid effusion. Other: None. ASPECTS [facility] Stroke Program Early CT Score) - Ganglionic level infarction (caudate, lentiform nuclei, internal capsule, insula, M1-M3 cortex): 7 - Supraganglionic infarction (M4-M6 cortex): 3 Total score (0-10 with 10 being normal): 10 IMPRESSION: 1. No acute intracranial infarct or other abnormality. 2. ASPECTS is 10. 3. 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 Critical Value/emergent results were called by telephone at the time of interpretation on 09/22/2020 at 9:33 pm to provider MARK QUALE , who verbally acknowledged these results. Electronically Signed   By: Rise Mu M.D.   On: 09/22/2020 21:36    Scheduled Meds:  atorvastatin  40 mg Oral QHS   diltiazem  120 mg Oral Daily   rivaroxaban  20 mg Oral Q supper   tamsulosin  0.4 mg Oral Daily   Continuous Infusions:  sodium chloride 75 mL/hr at 09/23/20 0451     LOS: 0 days   Rickey Barbara, MD [facility] Pager On Amion  If 7PM-7AM, please contact night-coverage 09/23/2020, 3:26 PM

## 2020-09-24 DIAGNOSIS — R4701 Aphasia: Secondary | ICD-10-CM | POA: Diagnosis not present

## 2020-09-24 DIAGNOSIS — R410 Disorientation, unspecified: Secondary | ICD-10-CM | POA: Diagnosis not present

## 2020-09-24 DIAGNOSIS — Z0000 Encounter for general adult medical examination without abnormal findings: Secondary | ICD-10-CM

## 2020-09-24 DIAGNOSIS — R4182 Altered mental status, unspecified: Secondary | ICD-10-CM | POA: Diagnosis not present

## 2020-09-24 LAB — COMPREHENSIVE METABOLIC PANEL
ALT: 14 U/L (ref 0–44)
AST: 20 U/L (ref 15–41)
Albumin: 3.2 g/dL — ABNORMAL LOW (ref 3.5–5.0)
Alkaline Phosphatase: 52 U/L (ref 38–126)
Anion gap: 6 (ref 5–15)
BUN: 20 mg/dL (ref 8–23)
CO2: 26 mmol/L (ref 22–32)
Calcium: 8.4 mg/dL — ABNORMAL LOW (ref 8.9–10.3)
Chloride: 105 mmol/L (ref 98–111)
Creatinine, Ser: 1.2 mg/dL (ref 0.61–1.24)
GFR, Estimated: >60 mL/min (ref >60)
Glucose, Bld: 96 mg/dL (ref 70–99)
Potassium: 3.5 mmol/L (ref 3.5–5.1)
Sodium: 137 mmol/L (ref 135–145)
Total Bilirubin: 0.7 mg/dL (ref 0.3–1.2)
Total Protein: 6 g/dL — ABNORMAL LOW (ref 6.5–8.1)

## 2020-09-24 LAB — RPR: RPR Ser Ql: NONREACTIVE

## 2020-09-24 MED ORDER — CLOPIDOGREL BISULFATE 75 MG PO TABS: 75.0000 mg | ORAL_TABLET | Freq: Every day | ORAL | 0 refills | Status: DC

## 2020-09-24 MED ORDER — ATORVASTATIN CALCIUM 40 MG PO TABS: 40.0000 mg | ORAL_TABLET | Freq: Every day | ORAL | 0 refills | Status: AC

## 2020-09-24 NOTE — Consult Note (Signed)
[facility] Vascular Consult Note  MRN : [id number]  Ethan Ayers is a 80 y.o. (10/17/1940) male who presents with chief complaint of  Chief Complaint  Patient presents with   Altered Mental Status    Pt's wife called EMS for altered mental status onset around 1745. Pt normally A&Ox4 at baseline but was only x2 today with slurred speech that was resolved prior to EMS arrival. Pt's wife reports similar episodes over past couple months. Pt has current prostate infection and is taking cipro x 7 days.   [name]  History of Present Illness: I am asked to see the patient by Dr. Rhona Leavens for carotid stenosis and recent TIA/stroke symptoms.  The patient had difficulty with speech and word finding.  He also had discoordination of his right hand.  Confusion was associated with this.  This happened within the past 48 to 72 hours and seem to last a day or 2 although he may still have a little bit of confusion residual.  His right hand is back to normal.  His word finding seems to be much better.  No previous symptoms prior to this.  Was found to be in A. fib and started on Xarelto.  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  For this reason, we are consulted for further evaluation and treatment.  Current Facility-Administered Medications  Medication Dose Route Frequency Provider Last Rate Last Admin   acetaminophen (TYLENOL) tablet 650 mg  650 mg Oral Q6H PRN Mansy, Jan A, MD       Or   acetaminophen (TYLENOL) suppository 650 mg  650 mg Rectal Q6H PRN Mansy, Jan A, MD       atorvastatin (LIPITOR) tablet 40 mg  40 mg Oral QHS Bhagat, Srishti L, MD   40 mg at 09/23/20 2110   clopidogrel (PLAVIX) tablet 75 mg  75 mg Oral Daily Annice Needy, MD   75 mg at 09/24/20 0910   diltiazem (CARDIZEM CD) 24 hr capsule 120 mg  120 mg Oral Daily Mansy, Jan A, MD   120 mg at 09/24/20 0910   magnesium hydroxide (MILK OF  MAGNESIA) suspension 30 mL  30 mL Oral Daily PRN Mansy, Jan A, MD       ondansetron [facility]) tablet 4 mg  4 mg Oral Q6H PRN Mansy, Jan A, MD       Or   ondansetron [facility]) injection 4 mg  4 mg Intravenous Q6H PRN Mansy, Jan A, MD       phenylephrine (NEO-SYNEPHRINE) 0.5 % nasal solution 2 drop  2 drop Each Nare Q4H PRN Manuela Schwartz, NP       rivaroxaban Carlena Hurl) tablet 20 mg  20 mg Oral Q supper Mansy, Jan A, MD   20 mg at 09/23/20 1728   tamsulosin (FLOMAX) capsule 0.4 mg  0.4 mg Oral Daily Mansy, Jan A, MD   0.4 mg at 09/24/20 0910   traZODone (DESYREL) tablet 25 mg  25 mg Oral QHS PRN Mansy, Vernetta Honey, MD       Past medical history Atrial fibrillation BPH Hypertension  Past surgical history Tonsillectomy   Social History   Tobacco Use   Smoking status: Never   Smokeless tobacco: Never  Substance Use Topics   Alcohol use: No   Drug use: No    Family History Positive coronary artery disease in his father a TIA  in his mother No history of clotting disorders, bleeding disorders, or aneurysms  No Known Allergies   REVIEW OF SYSTEMS (Negative unless checked)  Constitutional: Weight loss  Fever  Chills Cardiac: Chest pain   Chest pressure   Palpitations   Shortness of breath when laying flat   Shortness of breath at rest   Shortness of breath with exertion. Vascular:  Pain in legs with walking   Pain in legs at rest   Pain in legs when laying flat   Claudication   Pain in feet when walking  Pain in feet at rest  Pain in feet when laying flat   History of DVT   Phlebitis   Swelling in legs   Varicose veins   Non-healing ulcers Pulmonary:   Uses home oxygen   Productive cough   Hemoptysis   Wheeze  COPD   Asthma Neurologic:  Dizziness  Blackouts   Seizures   History of stroke   History of TIA  Aphasia   Temporary blindness   Dysphagia   Weakness or numbness in arms   Weakness or numbness in  legs Musculoskeletal:  Arthritis   Joint swelling   Joint pain   Low back pain Hematologic:  Easy bruising  Easy bleeding   Hypercoagulable state   Anemic  Hepatitis Gastrointestinal:  Blood in stool   Vomiting blood  Gastroesophageal reflux/heartburn   Difficulty swallowing. Genitourinary:  Chronic kidney disease   Difficult urination  Frequent urination  Burning with urination   Blood in urine Skin:  Rashes   Ulcers   Wounds Psychological:  History of anxiety    History of major depression.  Physical Examination  Vitals:   09/23/20 2004 09/24/20 0045 09/24/20 0447 09/24/20 0856  BP: (!) 148/77 (!) 152/76 123/87 (!) 145/74  Pulse: 77 92 82 94  Resp: Temp: 98.3 F (36.8 C) 98 F (36.7 C) 98.4 F (36.9 C) 98.2 F (36.8 C)  TempSrc:    Oral  SpO2: 99% 98% 96% 98%  Weight:      Height:       Body mass index is 32.53 kg/m. Gen:  WD/WN, NAD.  Appears younger than stated age Head: [place]/AT, No temporalis wasting.  Ear/Nose/Throat: Hearing grossly intact, nares w/o erythema or drainage, oropharynx w/o Erythema/Exudate Eyes: Sclera non-icteric, conjunctiva clear Neck: Trachea midline.  No JVD.  Pulmonary:  Good air movement, respirations not labored, equal bilaterally.  Cardiac: Irregularly irregular Vascular:  Vessel Right Left  Radial Palpable Palpable                                   Gastrointestinal: soft, non-tender/non-distended. No guarding/reflex.  Musculoskeletal: M/S 5/5 throughout.  Extremities without ischemic changes.  No deformity or atrophy. No edema. Neurologic: Sensation grossly intact in extremities.  Symmetrical.  Speech is fluent. Motor exam as listed above. Psychiatric: Judgment intact, Mood & affect appropriate for pt's clinical situation. Dermatologic: No rashes or ulcers noted.  No cellulitis or open wounds.      CBC Lab Results  Component Value Date   WBC 9.8 09/23/2020   HGB 13.0  09/23/2020   HCT 38.8 (L) 09/23/2020   MCV 93.0 09/23/2020   PLT 315 09/23/2020    BMET    Component Value Date/Time   NA 137 09/24/2020 0451   K 3.5 09/24/2020 0451   CL 105 09/24/2020 0451   CO2 26 09/24/2020  0451   GLUCOSE 96 09/24/2020 0451   BUN 20 09/24/2020 0451   CREATININE 1.20 09/24/2020 0451   CALCIUM 8.4 (L) 09/24/2020 0451   GFRNONAA >60 09/24/2020 0451   Estimated Creatinine Clearance: 58.7 mL/min (by C-G formula based on SCr of 1.2 mg/dL).  COAG Lab Results  Component Value Date   INR 3.6 (H) 09/22/2020    Radiology MR ANGIO HEAD WO CONTRAST  Result Date: 09/23/2020 CLINICAL DATA:  80 year old male code stroke presentation. Altered mental status and slurred speech. EXAM: MRA HEAD WITHOUT CONTRAST TECHNIQUE: Angiographic images of the Circle of Willis were acquired using MRA technique without intravenous contrast. COMPARISON:  Brain MRI today. FINDINGS: Antegrade flow in the distal left vertebral artery without stenosis to the vertebrobasilar junction. Left PICA origin is normal. However, severely attenuated flow signal in the distal right vertebral artery which appears highly stenotic although might remain patent (series 1036, image 11). Basilar artery is patent with mild irregularity but no significant stenosis. Bilateral AICA, SCA, and PCA origins are patent. Posterior communicating arteries are diminutive or absent. There is mild irregularity and stenosis at the left PCA origin. Other left PCA branches are within normal limits. There is mild to moderate right PCA P2 irregularity and stenosis. Other right PCA branches are within normal limits. Antegrade flow in both ICA siphons. Tortuous distal cervical left ICA. Mild to moderate cavernous and supraclinoid ICA irregularity in keeping with atherosclerosis, but only mild right ICA supraclinoid stenosis occurs. Ophthalmic artery origins are within normal limits. Patent carotid termini, MCA and ACA origins. Mild irregularity  at the right ACA origin without definite stenosis. Anterior communicating artery appears normal. Visible ACA branches are within normal limits. Left MCA M1 segment bifurcates early without stenosis. Visible left MCA branches are within normal limits. Right MCA M1 segment also bifurcates early with mild irregularity but no significant stenosis. Visible right MCA branches appear patent with mild irregularity. IMPRESSION: 1. Severely stenotic distal Right Vertebral Artery, although it might remain patent to the Basilar. 2. Other anterior and posterior circulation atherosclerosis, but with generally mild stenoses. Up to moderate stenosis of the right PCA P2 segment. Electronically Signed   By: Odessa Fleming M.D.   On: 09/23/2020 05:03   MR BRAIN WO CONTRAST  Result Date: 09/23/2020 CLINICAL DATA:  80 year old male code stroke presentation. Altered mental status and slurred speech. EXAM: MRI HEAD WITHOUT CONTRAST TECHNIQUE: Multiplanar, multiecho pulse sequences of the brain and surrounding structures were obtained without intravenous contrast. COMPARISON:  Plain head CT 09/22/2020. FINDINGS: Brain: No restricted diffusion to suggest acute infarction. No midline shift, mass effect, evidence of mass lesion, ventriculomegaly, extra-axial collection or acute intracranial hemorrhage. Cervicomedullary junction and pituitary are within normal limits. Patchy and confluent bilateral cerebral white matter T2 and FLAIR hyperintensity. Solitary chronic microhemorrhage in the left lower parietal lobe series 13, image 36. No cortical encephalomalacia or other chronic cerebral blood products. Deep gray matter nuclei appear within normal limits. There is mild T2 heterogeneity in the pons. Cerebellum is within normal limits. Vascular: Distal right vertebral artery flow void is lost on series 10, image 5. But the other major intracranial vascular flow voids are preserved. See intracranial MRA reported separately. Skull and upper cervical  spine: Negative. Normal bone marrow signal. Sinuses/Orbits: Postoperative changes to both globes, otherwise negative orbits. Paranasal sinuses and mastoids are stable and well aerated. Other: Visible internal auditory structures appear normal. Negative visible scalp and face. IMPRESSION: 1. No acute intracranial abnormality. 2. Evidence of poor flow  versus occlusion of the distal right vertebral artery, see Intracranial MRA reported separately. 3. Moderate for age signal changes in the cerebral white matter with a solitary chronic microhemorrhage in the left hemisphere. Favor chronic small vessel disease. Electronically Signed   By: Odessa FlemingH  Hall M.D.   On: 09/23/2020 04:57   MR LUMBAR SPINE WO CONTRAST  Result Date: 08/28/2020 CLINICAL DATA:  Low back pain with bilateral sciatica, unspecified back pain laterality, unspecified chronicity; lumbar radiculopathy. EXAM: MRI LUMBAR SPINE WITHOUT CONTRAST TECHNIQUE: Multiplanar, multisequence MR imaging of the lumbar spine was performed. No intravenous contrast was administered. COMPARISON:  None. FINDINGS: Segmentation:  Standard. Alignment:  Physiologic. Vertebrae: No fracture, evidence of discitis, or bone lesion. Endplate degenerative changes at L3-4, L4-5 and L5-S1. Conus medullaris and cauda equina: Conus extends to the L2 level. Conus and cauda equina appear normal. Paraspinal and other soft tissues: Negative. Disc levels: T12-L1: No spinal canal or neural foraminal stenosis. L1-2: No spinal canal or neural foraminal stenosis. L2-3: Mild facet degenerative changes. No spinal canal or neural foraminal stenosis. L3-4: Loss of disc height, disc bulge with associated osteophytic component and superimposed central disc protrusion, mild facet degenerative changes. Findings result in mild spinal canal stenosis with narrowing of the bilateral subarticular zones and mild bilateral neural foraminal narrowing. L4-5: Loss of disc height, shallow disc bulge with superimposed  central disc protrusion mild facet degenerative changes without significant spinal canal or neural foraminal stenosis. L5-S1: Shallow disc bulge and moderate facet degenerative changes resulting in mild left neural foraminal narrowing. No significant spinal canal stenosis. IMPRESSION: 1. 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 2. Mild left neural foraminal narrowing at L5-S1. Electronically Signed   By: Baldemar LenisKatyucia  de Macedo Rodrigues M.D.   On: 08/28/2020 16:46   US Carotid Bilateral  Result Date: 09/23/2020 CLINICAL DATA:  80 year old male with a history of stroke symptoms EXAM: BILATERAL CAROTID DUPLEX ULTRASOUND TECHNIQUE: Wallace CullensGray scale imaging, color Doppler and duplex ultrasound were performed of bilateral carotid and vertebral arteries in the neck. COMPARISON:  None. FINDINGS: Criteria: Quantification of carotid stenosis is based on velocity parameters that correlate the residual internal carotid diameter with NASCET-based stenosis levels, using the diameter of the distal internal carotid lumen as the denominator for stenosis measurement. The following velocity measurements were obtained: RIGHT ICA:  Systolic 71 cm/sec, Diastolic 21 cm/sec CCA:  64 cm/sec SYSTOLIC ICA/CCA RATIO:  1.1 ECA:  73 cm/sec LEFT ICA:  Systolic 140 cm/sec, Diastolic 39 cm/sec CCA:  62 cm/sec SYSTOLIC ICA/CCA RATIO:  2.3 ECA:  91 cm/sec Right Brachial SBP: Not acquired Left Brachial SBP: Not acquired RIGHT CAROTID ARTERY: No significant calcified disease of the right common carotid artery. Intermediate waveform maintained. Heterogeneous plaque without significant calcifications at the right carotid bifurcation. Low resistance waveform of the right ICA. No significant tortuosity. RIGHT VERTEBRAL ARTERY: Antegrade flow with low resistance waveform. LEFT CAROTID ARTERY: No significant calcified disease of the left common carotid artery. Intermediate  waveform maintained. Heterogeneous plaque at the left carotid bifurcation without significant calcifications. Low resistance waveform of the left ICA. LEFT VERTEBRAL ARTERY:  Antegrade flow with low resistance waveform. IMPRESSION: Right: Color duplex indicates moderate heterogeneous plaque with no hemodynamically significant stenosis by duplex criteria in the extracranial cerebrovascular circulation. Left: Heterogeneous plaque at the left carotid bifurcation contributes to 50%-69% stenosis by established duplex criteria. Signed, Yvone NeuJaime S. Reyne DumasWagner, DO, RPVI [facility] Electronically Signed  By: Gilmer Mor D.O.   On: 09/23/2020 12:13   EEG adult  Result Date: 09/23/2020 Charlsie Quest, MD     09/23/2020  3:22 PM Patient Name: JACADEN FORBUSH MRN: [id number] Epilepsy Attending: Charlsie Quest Referring Physician/Provider: Dr Brooke Dare Date: 09/23/2020 Duration: 25.16 mins Patient history: 80yo M with history of speech impairment, possible right arm tingling. EEG to evaluate for seizure Level of alertness: Awake, asleep AEDs during EEG study: None Technical aspects: This EEG study was done with scalp electrodes positioned according to the 10-20 International system of electrode placement. Electrical activity was acquired at a sampling rate of  and reviewed with a high frequency filter of  and a low frequency filter of . EEG data were recorded continuously and digitally stored. Description: The posterior dominant rhythm consists of  activity of moderate voltage (25-35 uV) seen predominantly in posterior head regions, symmetric and reactive to eye opening and eye closing. Sleep was characterized by vertex waves, sleep spindles (12 to 14 Hz), maximal frontocentral region.  Intermittent left frontotemporal 3 to  theta- delta slowing was also noted.  Physiologic photic driving was not seen during photic stimulation.  Hyperventilation was  not performed.   ABNORMALITY - Intermittent slow, left frontotemporal region IMPRESSION: This study is suggestive of nonspecific cortical dysfunction in left frontotemporal region.  No seizures or epileptiform discharges were seen throughout the recording. Charlsie Quest   ECHOCARDIOGRAM COMPLETE  Result Date: 09/23/2020    ECHOCARDIOGRAM REPORT   Patient Name:   ANTERRIO MCCLEERY Wigen Date of Exam: 09/23/2020 Medical Rec #:  [id number]      Height:       71.0 in Accession #:    [id number]     Weight:       233.2 lb Date of Birth:  1940-08-18      BSA:          2.251 m Patient Age:    80 years       BP:           146/88 mmHg Patient Gender: M              HR:           100 bpm. Exam Location:  [facility] Procedure: 2D Echo, Color Doppler and Cardiac Doppler Indications:     G45.9 TIA  History:         Patient has no prior history of Echocardiogram examinations.                  Arrythmias:Atrial Fibrillation; Risk Factors:Hypertension. Pt                  tested positive for COVID-19 on 01/13/20.  Sonographer:     Humphrey Rolls Referring Phys:  [id number] JAN A MANSY Diagnosing Phys: Sena Slate  Sonographer Comments: No subcostal window. Image acquisition challenging due to respiratory motion. IMPRESSIONS  1. Left ventricular ejection fraction, by estimation, is 55 to 60%. The left ventricle has normal function. The left ventricle has no regional wall motion abnormalities. Left ventricular diastolic parameters are consistent with Grade I diastolic dysfunction (impaired relaxation).  2. Right ventricular systolic function is normal. The right ventricular size is normal.  3. Left atrial size was moderately dilated.  4. Right atrial size was mildly dilated.  5. The mitral valve is normal in structure. Mild mitral valve regurgitation. No evidence of mitral stenosis.  6. The aortic valve is tricuspid. Aortic valve regurgitation is not visualized.  Mild aortic valve sclerosis is present, with no evidence of aortic valve stenosis. FINDINGS   Left Ventricle: Left ventricular ejection fraction, by estimation, is 55 to 60%. The left ventricle has normal function. The left ventricle has no regional wall motion abnormalities. The left ventricular internal cavity size was normal in size. There is  no left ventricular hypertrophy. Left ventricular diastolic parameters are consistent with Grade I diastolic dysfunction (impaired relaxation). Right Ventricle: The right ventricular size is normal. No increase in right ventricular wall thickness. Right ventricular systolic function is normal. Left Atrium: Left atrial size was moderately dilated. Right Atrium: Right atrial size was mildly dilated. Pericardium: There is no evidence of pericardial effusion. Mitral Valve: The mitral valve is normal in structure. Mild mitral valve regurgitation. No evidence of mitral valve stenosis. MV peak gradient, 5.2 mmHg. The mean mitral valve gradient is 2.0 mmHg. Tricuspid Valve: The tricuspid valve is normal in structure. Tricuspid valve regurgitation is mild. Aortic Valve: The aortic valve is tricuspid. Aortic valve regurgitation is not visualized. Mild aortic valve sclerosis is present, with no evidence of aortic valve stenosis. Aortic valve mean gradient measures 4.0 mmHg. Aortic valve peak gradient measures 8.6 mmHg. Aortic valve area, by VTI measures 1.95 cm. Pulmonic Valve: The pulmonic valve was not well visualized. Pulmonic valve regurgitation is not visualized. Aorta: The aortic root is normal in size and structure. Venous: The inferior vena cava was not well visualized. IAS/Shunts: The interatrial septum was not assessed.  LEFT VENTRICLE PLAX 2D LVIDd:         5.00 cm  Diastology LVIDs:         3.20 cm  LV e' medial:    12.50 cm/s LV PW:         0.80 cm  LV E/e' medial:  7.3 LV IVS:        0.70 cm  LV e' lateral:   16.20 cm/s LVOT diam:     2.10 cm  LV E/e' lateral: 5.6 LV SV:         47 LV SV Index:   21 LVOT Area:     3.46 cm  RIGHT VENTRICLE RV Basal diam:  3.50  cm LEFT ATRIUM             Index       RIGHT ATRIUM           Index LA diam:        3.70 cm 1.64 cm/m  RA Area:     17.50 cm LA Vol (A2C):   90.6 ml 40.25 ml/m RA Volume:   43.70 ml  19.41 ml/m LA Vol (A4C):   90.7 ml 40.29 ml/m LA Biplane Vol: 91.9 ml 40.83 ml/m  AORTIC VALVE                   PULMONIC VALVE AV Area (Vmax):    1.81 cm    PV Vmax:       0.90 m/s AV Area (Vmean):   2.02 cm    PV Vmean:      52.000 cm/s AV Area (VTI):     1.95 cm    PV VTI:        0.148 m AV Vmax:           147.00 cm/s PV Peak grad:  3.3 mmHg AV Vmean:          97.900 cm/s PV Mean grad:  1.0 mmHg AV VTI:  0.243 m AV Peak Grad:      8.6 mmHg AV Mean Grad:      4.0 mmHg LVOT Vmax:         77.00 cm/s LVOT Vmean:        57.000 cm/s LVOT VTI:          0.137 m LVOT/AV VTI ratio: 0.56  AORTA Ao Root diam: 3.40 cm MITRAL VALVE               TRICUSPID VALVE MV Area (PHT): 4.29 cm    TR Peak grad:   28.1 mmHg MV Area VTI:   3.31 cm    TR Vmax:        265.00 cm/s MV Peak grad:  5.2 mmHg MV Mean grad:  2.0 mmHg    SHUNTS MV Vmax:       1.14 m/s    Systemic VTI:  0.14 m MV Vmean:      60.3 cm/s   Systemic Diam: 2.10 cm MV Decel Time: 177 msec MV E velocity: 91.27 cm/s Sena Slate Electronically signed by Sena Slate Signature Date/Time: 09/23/2020/6:12:05 PM    Final    CT HEAD CODE STROKE WO CONTRAST  Result Date: 09/22/2020 CLINICAL DATA:  Code stroke. Initial evaluation for altered mental status, slurred speech. EXAM: CT HEAD WITHOUT CONTRAST TECHNIQUE: Contiguous axial images were obtained from the base of the skull through the vertex without intravenous contrast. COMPARISON:  None available. FINDINGS: Brain: Moderately advanced age-related cerebral atrophy with chronic small vessel ischemic disease. No acute intracranial hemorrhage. No visible acute large vessel territory infarct. No mass lesion, midline shift or mass effect. No hydrocephalus or extra-axial fluid collection. Vascular: No convincing hyperdense vessel.  Scattered vascular calcifications noted within the carotid siphons. Skull: Scalp soft tissues and calvarium within normal limits. Sinuses/Orbits: Globes and orbital soft tissues demonstrate no acute finding. Mild scattered mucosal thickening noted within the ethmoidal air cells and maxillary sinuses. Paranasal sinuses are otherwise clear. No mastoid effusion. Other: None. ASPECTS [facility] Stroke Program Early CT Score) - Ganglionic level infarction (caudate, lentiform nuclei, internal capsule, insula, M1-M3 cortex): 7 - Supraganglionic infarction (M4-M6 cortex): 3 Total score (0-10 with 10 being normal): 10 IMPRESSION: 1. No acute intracranial infarct or other abnormality. 2. ASPECTS is 10. 3. 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 Critical Value/emergent results were called by telephone at the time of interpretation on 09/22/2020 at 9:33 pm to provider MARK QUALE , who verbally acknowledged these results. Electronically Signed   By: Rise Mu M.D.   On: 09/22/2020 21:36      Assessment/Plan 1.  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  MRI was negative for acute stroke, but he had symptoms consistent with embolic phenomenon from left carotid stenosis.  I had a long discussion with the patient and his family regarding treatment options.  Given his likely significant stenosis with symptoms, intervention is likely recommended.  I have recommended a carotid angiogram Monday, September 12.  This will allow him to been on Plavix for 5 days.  At this time, if stenosis of 60% or more is identified, and anatomy is appropriate for carotid stenting this will be performed.  If the anatomy is not appropriate for carotid stenting, carotid endarterectomy be performed at a later date.  If stenosis is less than 60%, medical management will be recommended.  Risks and benefits were discussed and they are agreeable to proceed 2.  Hypertension.  Stable on outpatient  medications  and blood pressure control important in reducing the progression of atherosclerotic disease. On appropriate oral medications. 3.  Atrial fibrillation.  Transitioning to Xarelto and he can continue this through his procedure.   Festus Barren, MD  09/24/2020 2:10 PM    This note was created with Dragon medical transcription system.  Any error is purely unintentional

## 2020-09-24 NOTE — Discharge Summary (Signed)
Physician Discharge Summary  Lynnell JudeHoward E Coffin ZOX:[id number] DOB: 10-Feb-1940 DOA: 09/22/2020  PCP: Danella PentonMiller, Mark F, MD  Admit date: 09/22/2020 Discharge date: 09/24/2020  Admitted From: Home Disposition:  Home  Recommendations for Outpatient Follow-up:  Follow up with PCP in 1-2 weeks Follow up with Vascular Surgery on 08/28/20  Discharge Condition:Stable CODE STATUS:Full Diet recommendation: Heart healthy   Brief/Interim Summary: 80yo with history significant for paroxysmal atrial fibrillation, BPH and hypertension, who presented to the ER with acute onset of altered mental status with confusion as well as expressive dysphasia with dysarthria  Discharge Diagnoses:  Active Problems:   Altered mental status  1.  Altered mental status with confusion with associated expressive dysphagia and dysarthria -Patient reports symptoms have since improved, more oriented -MRI and MRA brain reviewed.  Evidence of severely stenotic distal right vertebral artery with poor flow -Carotid Dopplers notable for heterogeneous plaque at the left carotid bifurcation contributing to 50 to 69% stenosis -Case discussed with neurology who recommends vascular surgery consultation -Appreciate input by Vascular Surgery. Recommendation for carotid angiogram on 9/12, to continue xarelto with added plavix -No PT or OT needs identified  2.  BPH. -Continue Flomax as tolerated  3.  Chronic atrial fibrillation with controlled ventricular response. - Continued Xarelto and Cardizem CD as per above  4.  Essential hypertension. - We will continue his Cardizem CD. -Blood pressure trends stable at present     Discharge Instructions   Allergies as of 09/24/2020   No Known Allergies      Medication List     STOP taking these medications    hydrocortisone 1 % ointment   hydrOXYzine 25 MG tablet Commonly known as: ATARAX/VISTARIL       TAKE these medications    atorvastatin 40 MG tablet Commonly known as:  LIPITOR Take 1 tablet (40 mg total) by mouth at bedtime.   clopidogrel 75 MG tablet Commonly known as: PLAVIX Take 1 tablet (75 mg total) by mouth daily.   diltiazem 120 MG 24 hr capsule Commonly known as: CARDIZEM CD Take 120 mg by mouth daily.   rivaroxaban 20 MG Tabs tablet Commonly known as: XARELTO Take 20 mg by mouth daily with supper.   tamsulosin 0.4 MG Caps capsule Commonly known as: FLOMAX Take 0.4 mg by mouth daily.        Follow-up Information     Danella PentonMiller, Mark F, MD Follow up in 2 week(s).   Specialty: Internal Medicine Why: Hospital follow up Contact information: [address] [facility] [place] [address] [phone]         Annice Needyew, Jason S, MD Follow up on 09/28/2020.   Specialties: Vascular Surgery, Radiology, Interventional Cardiology Why: as scheduled Contact information: [address] [place] [address] [phone]                No Known Allergies  Consultations: Neurology Vascular Surgery  Procedures/Studies: MR ANGIO HEAD WO CONTRAST  Result Date: 09/23/2020 CLINICAL DATA:  80 year old male code stroke presentation. Altered mental status and slurred speech. EXAM: MRA HEAD WITHOUT CONTRAST TECHNIQUE: Angiographic images of the Circle of Willis were acquired using MRA technique without intravenous contrast. COMPARISON:  Brain MRI today. FINDINGS: Antegrade flow in the distal left vertebral artery without stenosis to the vertebrobasilar junction. Left PICA origin is normal. However, severely attenuated flow signal in the distal right vertebral artery which appears highly stenotic although might remain patent (series 1036, image 11). Basilar artery is patent with mild irregularity  but no significant stenosis. Bilateral AICA, SCA, and PCA origins are patent. Posterior communicating arteries are diminutive or absent. There is mild irregularity and stenosis at the left PCA origin. Other left PCA branches  are within normal limits. There is mild to moderate right PCA P2 irregularity and stenosis. Other right PCA branches are within normal limits. Antegrade flow in both ICA siphons. Tortuous distal cervical left ICA. Mild to moderate cavernous and supraclinoid ICA irregularity in keeping with atherosclerosis, but only mild right ICA supraclinoid stenosis occurs. Ophthalmic artery origins are within normal limits. Patent carotid termini, MCA and ACA origins. Mild irregularity at the right ACA origin without definite stenosis. Anterior communicating artery appears normal. Visible ACA branches are within normal limits. Left MCA M1 segment bifurcates early without stenosis. Visible left MCA branches are within normal limits. Right MCA M1 segment also bifurcates early with mild irregularity but no significant stenosis. Visible right MCA branches appear patent with mild irregularity. IMPRESSION: 1. Severely stenotic distal Right Vertebral Artery, although it might remain patent to the Basilar. 2. Other anterior and posterior circulation atherosclerosis, but with generally mild stenoses. Up to moderate stenosis of the right PCA P2 segment. Electronically Signed   By: Odessa Fleming M.D.   On: 09/23/2020 05:03   MR BRAIN WO CONTRAST  Result Date: 09/23/2020 CLINICAL DATA:  80 year old male code stroke presentation. Altered mental status and slurred speech. EXAM: MRI HEAD WITHOUT CONTRAST TECHNIQUE: Multiplanar, multiecho pulse sequences of the brain and surrounding structures were obtained without intravenous contrast. COMPARISON:  Plain head CT 09/22/2020. FINDINGS: Brain: No restricted diffusion to suggest acute infarction. No midline shift, mass effect, evidence of mass lesion, ventriculomegaly, extra-axial collection or acute intracranial hemorrhage. Cervicomedullary junction and pituitary are within normal limits. Patchy and confluent bilateral cerebral white matter T2 and FLAIR hyperintensity. Solitary chronic  microhemorrhage in the left lower parietal lobe series 13, image 36. No cortical encephalomalacia or other chronic cerebral blood products. Deep gray matter nuclei appear within normal limits. There is mild T2 heterogeneity in the pons. Cerebellum is within normal limits. Vascular: Distal right vertebral artery flow void is lost on series 10, image 5. But the other major intracranial vascular flow voids are preserved. See intracranial MRA reported separately. Skull and upper cervical spine: Negative. Normal bone marrow signal. Sinuses/Orbits: Postoperative changes to both globes, otherwise negative orbits. Paranasal sinuses and mastoids are stable and well aerated. Other: Visible internal auditory structures appear normal. Negative visible scalp and face. IMPRESSION: 1. No acute intracranial abnormality. 2. Evidence of poor flow versus occlusion of the distal right vertebral artery, see Intracranial MRA reported separately. 3. Moderate for age signal changes in the cerebral white matter with a solitary chronic microhemorrhage in the left hemisphere. Favor chronic small vessel disease. Electronically Signed   By: Odessa Fleming M.D.   On: 09/23/2020 04:57   MR LUMBAR SPINE WO CONTRAST  Result Date: 08/28/2020 CLINICAL DATA:  Low back pain with bilateral sciatica, unspecified back pain laterality, unspecified chronicity; lumbar radiculopathy. EXAM: MRI LUMBAR SPINE WITHOUT CONTRAST TECHNIQUE: Multiplanar, multisequence MR imaging of the lumbar spine was performed. No intravenous contrast was administered. COMPARISON:  None. FINDINGS: Segmentation:  Standard. Alignment:  Physiologic. Vertebrae: No fracture, evidence of discitis, or bone lesion. Endplate degenerative changes at L3-4, L4-5 and L5-S1. Conus medullaris and cauda equina: Conus extends to the L2 level. Conus and cauda equina appear normal. Paraspinal and other soft tissues: Negative. Disc levels: T12-L1: No spinal canal or neural foraminal stenosis. L1-2: No  spinal canal or neural foraminal stenosis. L2-3: Mild facet degenerative changes. No spinal canal or neural foraminal stenosis. L3-4: Loss of disc height, disc bulge with associated osteophytic component and superimposed central disc protrusion, mild facet degenerative changes. Findings result in mild spinal canal stenosis with narrowing of the bilateral subarticular zones and mild bilateral neural foraminal narrowing. L4-5: Loss of disc height, shallow disc bulge with superimposed central disc protrusion mild facet degenerative changes without significant spinal canal or neural foraminal stenosis. L5-S1: Shallow disc bulge and moderate facet degenerative changes resulting in mild left neural foraminal narrowing. No significant spinal canal stenosis. IMPRESSION: 1. 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 2. Mild left neural foraminal narrowing at L5-S1. Electronically Signed   By: Baldemar Lenis M.D.   On: 08/28/2020 16:46   US Carotid Bilateral  Result Date: 09/23/2020 CLINICAL DATA:  80 year old male with a history of stroke symptoms EXAM: BILATERAL CAROTID DUPLEX ULTRASOUND TECHNIQUE: Wallace Cullens scale imaging, color Doppler and duplex ultrasound were performed of bilateral carotid and vertebral arteries in the neck. COMPARISON:  None. FINDINGS: Criteria: Quantification of carotid stenosis is based on velocity parameters that correlate the residual internal carotid diameter with NASCET-based stenosis levels, using the diameter of the distal internal carotid lumen as the denominator for stenosis measurement. The following velocity measurements were obtained: RIGHT ICA:  Systolic 71 cm/sec, Diastolic 21 cm/sec CCA:  64 cm/sec SYSTOLIC ICA/CCA RATIO:  1.1 ECA:  73 cm/sec LEFT ICA:  Systolic 140 cm/sec, Diastolic 39 cm/sec CCA:  62 cm/sec SYSTOLIC ICA/CCA RATIO:  2.3 ECA:  91 cm/sec Right Brachial SBP: Not acquired  Left Brachial SBP: Not acquired RIGHT CAROTID ARTERY: No significant calcified disease of the right common carotid artery. Intermediate waveform maintained. Heterogeneous plaque without significant calcifications at the right carotid bifurcation. Low resistance waveform of the right ICA. No significant tortuosity. RIGHT VERTEBRAL ARTERY: Antegrade flow with low resistance waveform. LEFT CAROTID ARTERY: No significant calcified disease of the left common carotid artery. Intermediate waveform maintained. Heterogeneous plaque at the left carotid bifurcation without significant calcifications. Low resistance waveform of the left ICA. LEFT VERTEBRAL ARTERY:  Antegrade flow with low resistance waveform. IMPRESSION: Right: Color duplex indicates moderate heterogeneous plaque with no hemodynamically significant stenosis by duplex criteria in the extracranial cerebrovascular circulation. Left: Heterogeneous plaque at the left carotid bifurcation contributes to 50%-69% stenosis by established duplex criteria. Signed, Yvone Neu. Reyne Dumas, RPVI [facility] Radiology Electronically Signed   By: Gilmer Mor D.O.   On: 09/23/2020 12:13   EEG adult  Result Date: 09/23/2020 Charlsie Quest, MD     09/23/2020  3:22 PM Patient Name: NIRAV SWEDA MRN: [id number] Epilepsy Attending: Charlsie Quest Referring Physician/Provider: Dr Brooke Dare Date: 09/23/2020 Duration: 25.16 mins Patient history: 80yo M with history of speech impairment, possible right arm tingling. EEG to evaluate for seizure Level of alertness: Awake, asleep AEDs during EEG study: None Technical aspects: This EEG study was done with scalp electrodes positioned according to the 10-20 International system of electrode placement. Electrical activity was acquired at a sampling rate of  and reviewed with a high frequency filter of  and a low frequency filter of . EEG data were recorded continuously and  digitally stored. Description: The posterior dominant rhythm consists of  activity of moderate voltage (25-35 uV) seen predominantly in posterior head regions, symmetric and reactive to eye opening and eye  closing. Sleep was characterized by vertex waves, sleep spindles (12 to 14 Hz), maximal frontocentral region.  Intermittent left frontotemporal 3 to  theta- delta slowing was also noted.  Physiologic photic driving was not seen during photic stimulation.  Hyperventilation was not performed.   ABNORMALITY - Intermittent slow, left frontotemporal region IMPRESSION: This study is suggestive of nonspecific cortical dysfunction in left frontotemporal region.  No seizures or epileptiform discharges were seen throughout the recording. Charlsie Quest   ECHOCARDIOGRAM COMPLETE  Result Date: 09/23/2020    ECHOCARDIOGRAM REPORT   Patient Name:   KIZER NOBBE Kaufhold Date of Exam: 09/23/2020 Medical Rec #:  [id number]      Height:       71.0 in Accession #:    [id number]     Weight:       233.2 lb Date of Birth:  April 14, 1940      BSA:          2.251 m Patient Age:    80 years       BP:           146/88 mmHg Patient Gender: M              HR:           100 bpm. Exam Location:  [facility] Procedure: 2D Echo, Color Doppler and Cardiac Doppler Indications:     G45.9 TIA  History:         Patient has no prior history of Echocardiogram examinations.                  Arrythmias:Atrial Fibrillation; Risk Factors:Hypertension. Pt                  tested positive for COVID-19 on 01/13/20.  Sonographer:     Humphrey Rolls Referring Phys:  [id number] JAN A MANSY Diagnosing Phys: Sena Slate  Sonographer Comments: No subcostal window. Image acquisition challenging due to respiratory motion. IMPRESSIONS  1. Left ventricular ejection fraction, by estimation, is 55 to 60%. The left ventricle has normal function. The left ventricle has no regional wall motion abnormalities. Left ventricular diastolic parameters are consistent with Grade I diastolic  dysfunction (impaired relaxation).  2. Right ventricular systolic function is normal. The right ventricular size is normal.  3. Left atrial size was moderately dilated.  4. Right atrial size was mildly dilated.  5. The mitral valve is normal in structure. Mild mitral valve regurgitation. No evidence of mitral stenosis.  6. The aortic valve is tricuspid. Aortic valve regurgitation is not visualized. Mild aortic valve sclerosis is present, with no evidence of aortic valve stenosis. FINDINGS  Left Ventricle: Left ventricular ejection fraction, by estimation, is 55 to 60%. The left ventricle has normal function. The left ventricle has no regional wall motion abnormalities. The left ventricular internal cavity size was normal in size. There is  no left ventricular hypertrophy. Left ventricular diastolic parameters are consistent with Grade I diastolic dysfunction (impaired relaxation). Right Ventricle: The right ventricular size is normal. No increase in right ventricular wall thickness. Right ventricular systolic function is normal. Left Atrium: Left atrial size was moderately dilated. Right Atrium: Right atrial size was mildly dilated. Pericardium: There is no evidence of pericardial effusion. Mitral Valve: The mitral valve is normal in structure. Mild mitral valve regurgitation. No evidence of mitral valve stenosis. MV peak gradient, 5.2 mmHg. The mean mitral valve gradient is 2.0 mmHg. Tricuspid Valve: The tricuspid valve is normal in structure. Tricuspid valve regurgitation is  mild. Aortic Valve: The aortic valve is tricuspid. Aortic valve regurgitation is not visualized. Mild aortic valve sclerosis is present, with no evidence of aortic valve stenosis. Aortic valve mean gradient measures 4.0 mmHg. Aortic valve peak gradient measures 8.6 mmHg. Aortic valve area, by VTI measures 1.95 cm. Pulmonic Valve: The pulmonic valve was not well visualized. Pulmonic valve regurgitation is not visualized. Aorta: The aortic root  is normal in size and structure. Venous: The inferior vena cava was not well visualized. IAS/Shunts: The interatrial septum was not assessed.  LEFT VENTRICLE PLAX 2D LVIDd:         5.00 cm  Diastology LVIDs:         3.20 cm  LV e' medial:    12.50 cm/s LV PW:         0.80 cm  LV E/e' medial:  7.3 LV IVS:        0.70 cm  LV e' lateral:   16.20 cm/s LVOT diam:     2.10 cm  LV E/e' lateral: 5.6 LV SV:         47 LV SV Index:   21 LVOT Area:     3.46 cm  RIGHT VENTRICLE RV Basal diam:  3.50 cm LEFT ATRIUM             Index       RIGHT ATRIUM           Index LA diam:        3.70 cm 1.64 cm/m  RA Area:     17.50 cm LA Vol (A2C):   90.6 ml 40.25 ml/m RA Volume:   43.70 ml  19.41 ml/m LA Vol (A4C):   90.7 ml 40.29 ml/m LA Biplane Vol: 91.9 ml 40.83 ml/m  AORTIC VALVE                   PULMONIC VALVE AV Area (Vmax):    1.81 cm    PV Vmax:       0.90 m/s AV Area (Vmean):   2.02 cm    PV Vmean:      52.000 cm/s AV Area (VTI):     1.95 cm    PV VTI:        0.148 m AV Vmax:           147.00 cm/s PV Peak grad:  3.3 mmHg AV Vmean:          97.900 cm/s PV Mean grad:  1.0 mmHg AV VTI:            0.243 m AV Peak Grad:      8.6 mmHg AV Mean Grad:      4.0 mmHg LVOT Vmax:         77.00 cm/s LVOT Vmean:        57.000 cm/s LVOT VTI:          0.137 m LVOT/AV VTI ratio: 0.56  AORTA Ao Root diam: 3.40 cm MITRAL VALVE               TRICUSPID VALVE MV Area (PHT): 4.29 cm    TR Peak grad:   28.1 mmHg MV Area VTI:   3.31 cm    TR Vmax:        265.00 cm/s MV Peak grad:  5.2 mmHg MV Mean grad:  2.0 mmHg    SHUNTS MV Vmax:       1.14 m/s    Systemic VTI:  0.14 m MV Vmean:  60.3 cm/s   Systemic Diam: 2.10 cm MV Decel Time: 177 msec MV E velocity: 91.27 cm/s Sena Slate Electronically signed by Sena Slate Signature Date/Time: 09/23/2020/6:12:05 PM    Final    CT HEAD CODE STROKE WO CONTRAST  Result Date: 09/22/2020 CLINICAL DATA:  Code stroke. Initial evaluation for altered mental status, slurred speech. EXAM: CT HEAD WITHOUT  CONTRAST TECHNIQUE: Contiguous axial images were obtained from the base of the skull through the vertex without intravenous contrast. COMPARISON:  None available. FINDINGS: Brain: Moderately advanced age-related cerebral atrophy with chronic small vessel ischemic disease. No acute intracranial hemorrhage. No visible acute large vessel territory infarct. No mass lesion, midline shift or mass effect. No hydrocephalus or extra-axial fluid collection. Vascular: No convincing hyperdense vessel. Scattered vascular calcifications noted within the carotid siphons. Skull: Scalp soft tissues and calvarium within normal limits. Sinuses/Orbits: Globes and orbital soft tissues demonstrate no acute finding. Mild scattered mucosal thickening noted within the ethmoidal air cells and maxillary sinuses. Paranasal sinuses are otherwise clear. No mastoid effusion. Other: None. ASPECTS [facility] Stroke Program Early CT Score) - Ganglionic level infarction (caudate, lentiform nuclei, internal capsule, insula, M1-M3 cortex): 7 - Supraganglionic infarction (M4-M6 cortex): 3 Total score (0-10 with 10 being normal): 10 IMPRESSION: 1. No acute intracranial infarct or other abnormality. 2. ASPECTS is 10. 3. 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 Critical Value/emergent results were called by telephone at the time of interpretation on 09/22/2020 at 9:33 pm to provider MARK QUALE , who verbally acknowledged these results. Electronically Signed   By: Rise Mu M.D.   On: 09/22/2020 21:36    Subjective: Eager to go home  Discharge Exam: Vitals:   09/24/20 0447 09/24/20 0856  BP: 123/87 (!) 145/74  Pulse: 82 94  Resp: 18 16  Temp: 98.4 F (36.9 C) 98.2 F (36.8 C)  SpO2: 96% 98%   Vitals:   09/23/20 2004 09/24/20 0045 09/24/20 0447 09/24/20 0856  BP: (!) 148/77 (!) 152/76 123/87 (!) 145/74  Pulse: 77 92 82 94  Resp: Temp: 98.3 F (36.8 C) 98 F (36.7 C) 98.4 F  (36.9 C) 98.2 F (36.8 C)  TempSrc:    Oral  SpO2: 99% 98% 96% 98%  Weight:      Height:        General: Pt is alert, awake, not in acute distress Cardiovascular: RRR, S1/S2  Respiratory: CTA bilaterally, no wheezing, no rhonchi Abdominal: Soft, NT, ND, bowel sounds + Extremities: no edema, no cyanosis   The results of significant diagnostics from this hospitalization (including imaging, microbiology, ancillary and laboratory) are listed below for reference.     Microbiology: Recent Results (from the past 240 hour(s))  Resp Panel by RT-PCR (Flu A&B, Covid) Nasopharyngeal Swab     Status: None   Collection Time: 09/22/20  9:17 PM   Specimen: Nasopharyngeal Swab; Nasopharyngeal(NP) swabs in vial transport medium  Result Value Ref Range Status   SARS Coronavirus 2 by RT PCR NEGATIVE NEGATIVE Final    Comment: (NOTE) SARS-CoV-2 target nucleic acids are NOT DETECTED.  The SARS-CoV-2 RNA is generally detectable in upper respiratory specimens during the acute phase of infection. The lowest concentration of SARS-CoV-2 viral copies this assay can detect is 138 copies/mL. A negative result does not preclude SARS-Cov-2 infection and should not be used as the sole basis for treatment or other patient management decisions. A negative result may occur with  improper specimen collection/handling, submission of  specimen other than nasopharyngeal swab, presence of viral mutation(s) within the areas targeted by this assay, and inadequate number of viral copies(<138 copies/mL). A negative result must be combined with clinical observations, patient history, and epidemiological information. The expected result is Negative.  Fact Sheet for Patients:  BloggerCourse.com  Fact Sheet for Healthcare Providers:  SeriousBroker.it  This test is no t yet approved or cleared by the [place] FDA and  has been authorized for detection and/or  diagnosis of SARS-CoV-2 by FDA under an Emergency Use Authorization (EUA). This EUA will remain  in effect (meaning this test can be used) for the duration of the COVID-19 declaration under Section 564(b)(1) of the Act, 21 U.S.C.section 360bbb-3(b)(1), unless the authorization is terminated  or revoked sooner.       Influenza A by PCR NEGATIVE NEGATIVE Final   Influenza B by PCR NEGATIVE NEGATIVE Final    Comment: (NOTE) The Xpert Xpress SARS-CoV-2/FLU/RSV plus assay is intended as an aid in the diagnosis of influenza from Nasopharyngeal swab specimens and should not be used as a sole basis for treatment. Nasal washings and aspirates are unacceptable for Xpert Xpress SARS-CoV-2/FLU/RSV testing.  Fact Sheet for Patients: BloggerCourse.com  Fact Sheet for Healthcare Providers: SeriousBroker.it  This test is not yet approved or cleared by the [place] FDA and has been authorized for detection and/or diagnosis of SARS-CoV-2 by FDA under an Emergency Use Authorization (EUA). This EUA will remain in effect (meaning this test can be used) for the duration of the COVID-19 declaration under Section 564(b)(1) of the Act, 21 U.S.C. section 360bbb-3(b)(1), unless the authorization is terminated or revoked.  Performed at [facility], [address] [place], [place] [address]      Labs: BNP (last 3 results) No results for input(s): BNP in the last 8760 hours. Basic Metabolic Panel: Recent Labs  Lab 09/22/20 2117 09/23/20 0140 09/24/20 0451  NA 135 135 137  K 3.6 3.7 3.5  CL 101 100 105  CO2 GLUCOSE 102* 99 96  BUN CREATININE 1.39* 1.31* 1.20  CALCIUM 8.8* 8.7* 8.4*   Liver Function Tests: Recent Labs  Lab 09/22/20 2117 09/24/20 0451  AST 25 20  ALT 19 14  ALKPHOS 68 52  BILITOT 0.5 0.7  PROT 6.7 6.0*  ALBUMIN 3.5 3.2*   No results for input(s): LIPASE, AMYLASE in the last 168  hours. Recent Labs  Lab 09/23/20 1009  AMMONIA 10   CBC: Recent Labs  Lab 09/22/20 2117 09/23/20 0140  WBC 9.4 9.8  NEUTROABS 5.9  --   HGB 12.9* 13.0  HCT 36.3* 38.8*  MCV 93.1 93.0  PLT 295 315   Cardiac Enzymes: No results for input(s): CKTOTAL, CKMB, CKMBINDEX, TROPONINI in the last 168 hours. BNP: Invalid input(s): POCBNP CBG: Recent Labs  Lab 09/22/20 2116  GLUCAP 98   D-Dimer No results for input(s): DDIMER in the last 72 hours. Hgb A1c Recent Labs    09/23/20 0140  HGBA1C 5.7*   Lipid Profile Recent Labs    09/23/20 0140  CHOL 169  HDL 56  LDLCALC 103*  TRIG 48  CHOLHDL 3.0   Thyroid function studies No results for input(s): TSH, T4TOTAL, T3FREE, THYROIDAB in the last 72 hours.  Invalid input(s): FREET3 Anemia work up Recent Labs    09/23/20 1009  VITAMINB12 377   Urinalysis    Component Value Date/Time   COLORURINE YELLOW 09/23/2020 1000   APPEARANCEUR CLEAR 09/23/2020  1000   LABSPEC 1.015 09/23/2020 1000   PHURINE 7.5 09/23/2020 1000   GLUCOSEU NEGATIVE 09/23/2020 1000   HGBUR MODERATE (A) 09/23/2020 1000   BILIRUBINUR NEGATIVE 09/23/2020 1000   KETONESUR NEGATIVE 09/23/2020 1000   PROTEINUR NEGATIVE 09/23/2020 1000   NITRITE NEGATIVE 09/23/2020 1000   LEUKOCYTESUR NEGATIVE 09/23/2020 1000   Sepsis Labs Invalid input(s): PROCALCITONIN,  WBC,  LACTICIDVEN Microbiology Recent Results (from the past 240 hour(s))  Resp Panel by RT-PCR (Flu A&B, Covid) Nasopharyngeal Swab     Status: None   Collection Time: 09/22/20  9:17 PM   Specimen: Nasopharyngeal Swab; Nasopharyngeal(NP) swabs in vial transport medium  Result Value Ref Range Status   SARS Coronavirus 2 by RT PCR NEGATIVE NEGATIVE Final    Comment: (NOTE) SARS-CoV-2 target nucleic acids are NOT DETECTED.  The SARS-CoV-2 RNA is generally detectable in upper respiratory specimens during the acute phase of infection. The lowest concentration of SARS-CoV-2 viral copies this  assay can detect is 138 copies/mL. A negative result does not preclude SARS-Cov-2 infection and should not be used as the sole basis for treatment or other patient management decisions. A negative result may occur with  improper specimen collection/handling, submission of specimen other than nasopharyngeal swab, presence of viral mutation(s) within the areas targeted by this assay, and inadequate number of viral copies(<138 copies/mL). A negative result must be combined with clinical observations, patient history, and epidemiological information. The expected result is Negative.  Fact Sheet for Patients:  BloggerCourse.com  Fact Sheet for Healthcare Providers:  SeriousBroker.it  This test is no t yet approved or cleared by the [place] FDA and  has been authorized for detection and/or diagnosis of SARS-CoV-2 by FDA under an Emergency Use Authorization (EUA). This EUA will remain  in effect (meaning this test can be used) for the duration of the COVID-19 declaration under Section 564(b)(1) of the Act, 21 U.S.C.section 360bbb-3(b)(1), unless the authorization is terminated  or revoked sooner.       Influenza A by PCR NEGATIVE NEGATIVE Final   Influenza B by PCR NEGATIVE NEGATIVE Final    Comment: (NOTE) The Xpert Xpress SARS-CoV-2/FLU/RSV plus assay is intended as an aid in the diagnosis of influenza from Nasopharyngeal swab specimens and should not be used as a sole basis for treatment. Nasal washings and aspirates are unacceptable for Xpert Xpress SARS-CoV-2/FLU/RSV testing.  Fact Sheet for Patients: BloggerCourse.com  Fact Sheet for Healthcare Providers: SeriousBroker.it  This test is not yet approved or cleared by the [place] FDA and has been authorized for detection and/or diagnosis of SARS-CoV-2 by FDA under an Emergency Use Authorization (EUA). This EUA will  remain in effect (meaning this test can be used) for the duration of the COVID-19 declaration under Section 564(b)(1) of the Act, 21 U.S.C. section 360bbb-3(b)(1), unless the authorization is terminated or revoked.  Performed at [facility], [address] [place], [place] [address]    Time spent: 30 min  SIGNED:   Rickey Barbara, MD  [facility] 09/24/2020, 9:11 AM  If 7PM-7AM, please contact night-coverage

## 2020-09-24 NOTE — Progress Notes (Signed)
SLP Cancellation Note  Patient Details Name: Ethan Ayers MRN: [id number] DOB: 07/22/1940   Cancelled treatment:       Reason Eval/Treat Not Completed:  (chart reviewed; pt had discharged at this note time). Due to pt having discharged home this morning, this SLP contacted Wife at home re: pt's status. Pt's Wife stated pt's expressive cognitive-linguistic communication deficits have improved and that he has returned to his Baseline "almost, now that he's at home". She does not feel he needs any services at this time.  Wife agreed to reach out to Rehab/ST services next week post procedure if any new needs arise. Encouraged Wife to maintain routine and reduce distractions during ADLs as pt potentially has some Mild baseline deficits as per Neurology note("some baseline gradual decline") and to f/u w/ PCP w/ any changes or further decline. Wife agreed.       Jerilynn Som, MS, CCC-SLP Speech Language Pathologist Rehab Services [phone] [facility] 09/24/2020, 2:53 PM

## 2020-09-25 LAB — VITAMIN B1: Vitamin B1 (Thiamine): 144.5 nmol/L (ref 66.5–200.0)

## 2020-09-28 ENCOUNTER — Emergency Department
Admission: EM | Admit: 2020-09-28 | Discharge: 2020-09-28 | Disposition: A | Discharge status: Home / Self Care | Payer: Medicare Other | Source: Home / Self Care | Attending: Emergency Medicine | Admitting: Emergency Medicine

## 2020-09-28 ENCOUNTER — Other Ambulatory Visit: Payer: Self-pay

## 2020-09-28 ENCOUNTER — Other Ambulatory Visit (INDEPENDENT_AMBULATORY_CARE_PROVIDER_SITE_OTHER): Payer: Self-pay | Admitting: Nurse Practitioner

## 2020-09-28 DIAGNOSIS — Z7901 Long term (current) use of anticoagulants: Secondary | ICD-10-CM | POA: Insufficient documentation

## 2020-09-28 DIAGNOSIS — I6522 Occlusion and stenosis of left carotid artery: Secondary | ICD-10-CM | POA: Diagnosis not present

## 2020-09-28 DIAGNOSIS — R040 Epistaxis: Secondary | ICD-10-CM | POA: Insufficient documentation

## 2020-09-28 LAB — CBC
HCT: 38 % — ABNORMAL LOW (ref 39.0–52.0)
Hemoglobin: 13.2 g/dL (ref 13.0–17.0)
MCH: 32.4 pg (ref 26.0–34.0)
MCHC: 34.7 g/dL (ref 30.0–36.0)
MCV: 93.1 fL (ref 80.0–100.0)
Platelets: 281 10*3/uL (ref 150–400)
RBC: 4.08 MIL/uL — ABNORMAL LOW (ref 4.22–5.81)
RDW: 14.3 % (ref 11.5–15.5)
WBC: 10.6 10*3/uL — ABNORMAL HIGH (ref 4.0–10.5)
nRBC: 0 % (ref 0.0–0.2)

## 2020-09-28 LAB — METHYLMALONIC ACID, SERUM: Methylmalonic Acid, Quantitative: 217 nmol/L (ref 0–378)

## 2020-09-28 MED ORDER — CEPHALEXIN 500 MG PO CAPS: 500.0000 mg | ORAL_CAPSULE | Freq: Once | ORAL | Status: DC

## 2020-09-28 MED ORDER — TRANEXAMIC ACID FOR EPISTAXIS
500.0000 mg | Freq: Once | TOPICAL | Status: DC
  Filled 2020-09-28: qty 10

## 2020-09-28 MED ORDER — CEPHALEXIN 500 MG PO CAPS: 500.0000 mg | ORAL_CAPSULE | Freq: Three times a day (TID) | ORAL | 0 refills | Status: AC

## 2020-09-28 MED ORDER — HYDRALAZINE HCL 20 MG/ML IJ SOLN
2.0000 mg | Freq: Once | INTRAMUSCULAR | Status: AC
  Administered 2020-09-28: 2 mg via INTRAVENOUS
  Filled 2020-09-28: qty 1

## 2020-09-28 MED ORDER — OXYMETAZOLINE HCL 0.05 % NA SOLN
1.0000 | Freq: Once | NASAL | Status: AC
  Administered 2020-09-28: 1 via NASAL

## 2020-09-28 NOTE — ED Notes (Signed)
Green and blue top in lab

## 2020-09-28 NOTE — ED Provider Notes (Signed)
[facility] Emergency Department Provider Note  ____________________________________________  Time seen: Approximately 1:55 AM  I have reviewed the triage vital signs and the nursing notes.   HISTORY  Chief Complaint Epistaxis   HPI Ethan Ayers is a 80 y.o. male on Plavix and Xarelto who presents for evaluation of a nosebleed.  Patient has had intermittent lows bleed for almost 24 hours.  The bleed is from the right nare.  Patient has a history of prior nosebleed requiring cauterization by ENT.  Denies any dizziness, chest pain or shortness of breath.  Family has tried pressure and Afrin which has helped intermittently.   No past medical history on file.  Patient Active Problem List   Diagnosis Date Noted   Altered mental status 09/22/2020    No past surgical history on file.  Prior to Admission medications   Medication Sig Start Date End Date Taking? Authorizing Provider  cephALEXin (KEFLEX) 500 MG capsule Take 1 capsule (500 mg total) by mouth 3 (three) times daily for 7 days. 09/28/20 10/05/20 Yes Danilo Cappiello, [place], MD  atorvastatin (LIPITOR) 40 MG tablet Take 1 tablet (40 mg total) by mouth at bedtime. 09/24/20 10/24/20  Jerald Kief, MD  clopidogrel (PLAVIX) 75 MG tablet Take 1 tablet (75 mg total) by mouth daily. 09/24/20 10/24/20  Jerald Kief, MD  diltiazem (CARDIZEM CD) 120 MG 24 hr capsule Take 120 mg by mouth daily. 07/23/20   [provider]  rivaroxaban (XARELTO) 20 MG TABS tablet Take 20 mg by mouth daily with supper.    [provider]  tamsulosin (FLOMAX) 0.4 MG CAPS capsule Take 0.4 mg by mouth daily. 09/08/20   [provider]    Allergies Patient has no known allergies.  No family history on file.  Social History Social History   Tobacco Use   Smoking status: Never   Smokeless tobacco: Never  Substance Use Topics   Alcohol use: No   Drug use: No    Review of Systems  Constitutional: Negative  for fever. Eyes: Negative for visual changes. ENT: Negative for sore throat. + R nose bleed Neck: No neck pain  Cardiovascular: Negative for chest pain. Respiratory: Negative for shortness of breath. Gastrointestinal: Negative for abdominal pain, vomiting or diarrhea. Genitourinary: Negative for dysuria. Musculoskeletal: Negative for back pain. Skin: Negative for rash. Neurological: Negative for headaches, weakness or numbness. Psych: No SI or HI  ____________________________________________   PHYSICAL EXAM:  VITAL SIGNS: ED Triage Vitals [09/28/20 0017]  Enc Vitals Group     BP 140/90     Pulse Rate (!) 42     Resp 16     Temp 98.1 F (36.7 C)     Temp src      SpO2 96 %     Weight      Height      Head Circumference      Peak Flow      Pain Score      Pain Loc      Pain Edu?      Excl. in GC?     Constitutional: Alert and oriented. Well appearing and in no apparent distress. HEENT:      Head: Normocephalic and atraumatic.         Eyes: Conjunctivae are normal. Sclera is non-icteric.       Nose: Active bleeding from the right nare limiting exam      Mouth/Throat: Mucous membranes are moist.  Neck: Supple with no signs of meningismus. Cardiovascular: Regular rate and rhythm.  Respiratory: Normal respiratory effort. Lungs are clear to auscultation bilaterally.  Musculoskeletal:  No edema, cyanosis, or erythema of extremities. Neurologic: Normal speech and language. Face is symmetric. Moving all extremities. No gross focal neurologic deficits are appreciated. Skin: Skin is warm, dry and intact. No rash noted. Psychiatric: Mood and affect are normal. Speech and behavior are normal.  ____________________________________________   LABS (all labs ordered are listed, but only abnormal results are displayed)  Labs Reviewed  CBC - Abnormal; Notable for the following components:      Result Value   WBC 10.6 (*)    RBC 4.08 (*)    HCT 38.0 (*)    All other  components within normal limits   ____________________________________________  EKG  none  ____________________________________________  RADIOLOGY  none  ____________________________________________   PROCEDURES  Procedure(s) performed: yes Procedures  Epistaxis Management Date/Time: 09/28/2020 at 1:56 AM Performed by: Nita Sickle Authorized by: Nita Sickle Consent: Verbal consent obtained. Written consent not obtained. Risks and benefits: risks, benefits and alternatives were discussed Consent given by: patient Required items: required blood products, implants, devices, and special equipment available Patient sedated: no 1st attempt: Afrin with pressure was unsuccessful.   2nd attempt: Rolled up 2x2 gauze soaked in tranexamic acid (about 5 mL / 500 mg) inserted into affected naris/nares with pressure was also unsuccessful 3rd attempt: merocel packing, also unsuccessful 4th attempt: Rhino rocket coated on TXA, successful Post-procedure assessment: bleeding stopped after 3rd attempt Treatment complexity: simple Patient tolerance: Patient tolerated the procedure well with no immediate complications   Critical Care performed:  None ____________________________________________   INITIAL IMPRESSION / ASSESSMENT AND PLAN / ED COURSE  80 y.o. male on Plavix and Xarelto who presents for evaluation of a nosebleed.  Patient has had intermittent lows bleed for almost 24 hours.  Patient arrives hemodynamically stable with normal hemoglobin with active right-sided epistaxis.  Several attempts to stop bleeding per procedure note above.  Merisel packing was also unsuccessful patient continued to have bleeding anteriorly.  At that point discussed with Dr. Jenne Campus from ENT who recommended removing Merocel when applying a Rhino Rocket instead.  _________________________ 4:04 AM on 09/28/2020 ----------------------------------------- Rhino Rocket coated on TXA  was applied with resolution of the bleed. Patient started on Keflex.  Recommended follow-up with Dr. Jenne Campus in 2 days for further intervention.  Discussed care at home with patient and his wife.  Discussed my standard return precautions     _____________________________________________ Please note:  Patient was evaluated in Emergency Department today for the symptoms described in the history of present illness. Patient was evaluated in the context of the global COVID-19 pandemic, which necessitated consideration that the patient might be at risk for infection with the SARS-CoV-2 virus that causes COVID-19. Institutional protocols and algorithms that pertain to the evaluation of patients at risk for COVID-19 are in a state of rapid change based on information released by regulatory bodies including the CDC and federal and state organizations. These policies and algorithms were followed during the patient's care in the ED.  Some ED evaluations and interventions may be delayed as a result of limited staffing during the pandemic.   [place] Controlled Substance Database was reviewed by me. ____________________________________________   FINAL CLINICAL IMPRESSION(S) / ED DIAGNOSES   Final diagnoses:  Right-sided epistaxis      NEW MEDICATIONS STARTED DURING THIS VISIT:  ED Discharge Orders  Ordered    cephALEXin (KEFLEX) 500 MG capsule  3 times daily        09/28/20 0404             Note:  This document was prepared using Dragon voice recognition software and may include unintentional dictation errors.    Don Perking, [place], MD 09/28/20 [id number]

## 2020-09-28 NOTE — ED Triage Notes (Addendum)
Per spouse pt with right epistaxis that began at 0500 this am. Spouse states is on eliquis and plavix.  Pt encouraged to place nasal clamp to control bleeding on nares, spouse states "it makes himsore". Pt with steady constant stream of blood noted even from clamping.

## 2020-09-28 NOTE — Discharge Instructions (Signed)
Follow-up with Dr. Jenne Campus in 2 days.  In the meantime keep the [employer] in place.  Take Keflex 3 times a day until the packing comes out.  Return to the emergency room for any further bleeding.

## 2020-09-29 ENCOUNTER — Telehealth (INDEPENDENT_AMBULATORY_CARE_PROVIDER_SITE_OTHER): Payer: Self-pay

## 2020-09-29 ENCOUNTER — Other Ambulatory Visit
Admission: RE | Admit: 2020-09-29 | Discharge: 2020-09-29 | Disposition: A | Discharge status: Home / Self Care | Payer: Medicare Other | Source: Ambulatory Visit | Attending: Vascular Surgery | Admitting: Vascular Surgery

## 2020-09-29 DIAGNOSIS — Z01812 Encounter for preprocedural laboratory examination: Secondary | ICD-10-CM | POA: Insufficient documentation

## 2020-09-29 DIAGNOSIS — Z20822 Contact with and (suspected) exposure to covid-19: Secondary | ICD-10-CM | POA: Insufficient documentation

## 2020-09-29 LAB — SARS CORONAVIRUS 2 (TAT 6-24 HRS): SARS Coronavirus 2: NEGATIVE

## 2020-09-29 NOTE — Telephone Encounter (Signed)
Patient was reschedule to 09/30/20. Patient should hold his Xarelto until the procedure and continue his Plavix per Dr Wyn Quaker. Pre-procedure instructions were giving to patient spouse on 09/28/20

## 2020-09-30 ENCOUNTER — Encounter: Payer: Self-pay | Admitting: Vascular Surgery

## 2020-09-30 ENCOUNTER — Encounter
Admission: RE | Disposition: A | Discharge status: Home / Self Care | Payer: Self-pay | Source: Ambulatory Visit | Attending: Vascular Surgery

## 2020-09-30 ENCOUNTER — Other Ambulatory Visit: Payer: Self-pay

## 2020-09-30 ENCOUNTER — Inpatient Hospital Stay
Admission: RE | Admit: 2020-09-30 | Discharge: 2020-10-01 | DRG: 027 | Disposition: A | Discharge status: Home / Self Care | Payer: Medicare Other | Source: Ambulatory Visit | Attending: Vascular Surgery | Admitting: Vascular Surgery

## 2020-09-30 DIAGNOSIS — I63232 Cerebral infarction due to unspecified occlusion or stenosis of left carotid arteries: Secondary | ICD-10-CM | POA: Diagnosis not present

## 2020-09-30 DIAGNOSIS — I10 Essential (primary) hypertension: Secondary | ICD-10-CM | POA: Diagnosis present

## 2020-09-30 DIAGNOSIS — R4701 Aphasia: Secondary | ICD-10-CM | POA: Diagnosis present

## 2020-09-30 DIAGNOSIS — Z006 Encounter for examination for normal comparison and control in clinical research program: Secondary | ICD-10-CM

## 2020-09-30 DIAGNOSIS — I6529 Occlusion and stenosis of unspecified carotid artery: Secondary | ICD-10-CM | POA: Diagnosis present

## 2020-09-30 DIAGNOSIS — Z20822 Contact with and (suspected) exposure to covid-19: Secondary | ICD-10-CM | POA: Diagnosis present

## 2020-09-30 DIAGNOSIS — R040 Epistaxis: Secondary | ICD-10-CM | POA: Diagnosis present

## 2020-09-30 DIAGNOSIS — Z8673 Personal history of transient ischemic attack (TIA), and cerebral infarction without residual deficits: Secondary | ICD-10-CM

## 2020-09-30 DIAGNOSIS — N400 Enlarged prostate without lower urinary tract symptoms: Secondary | ICD-10-CM | POA: Diagnosis present

## 2020-09-30 DIAGNOSIS — I6522 Occlusion and stenosis of left carotid artery: Principal | ICD-10-CM | POA: Diagnosis present

## 2020-09-30 DIAGNOSIS — I63239 Cerebral infarction due to unspecified occlusion or stenosis of unspecified carotid arteries: Secondary | ICD-10-CM | POA: Diagnosis present

## 2020-09-30 HISTORY — PX: CAROTID PTA/STENT INTERVENTION: CATH118231

## 2020-09-30 HISTORY — DX: Essential (primary) hypertension: I10

## 2020-09-30 HISTORY — DX: Transient cerebral ischemic attack, unspecified: G45.9

## 2020-09-30 HISTORY — DX: Benign prostatic hyperplasia without lower urinary tract symptoms: N40.0

## 2020-09-30 HISTORY — DX: Unspecified atrial fibrillation: I48.91

## 2020-09-30 LAB — POCT ACTIVATED CLOTTING TIME: Activated Clotting Time: 237 seconds

## 2020-09-30 LAB — BUN: BUN: 23 mg/dL (ref 8–23)

## 2020-09-30 LAB — CREATININE, SERUM
Creatinine, Ser: 1.42 mg/dL — ABNORMAL HIGH (ref 0.61–1.24)
GFR, Estimated: 49 mL/min — ABNORMAL LOW (ref >60)

## 2020-09-30 LAB — MRSA NEXT GEN BY PCR, NASAL: MRSA by PCR Next Gen: NOT DETECTED

## 2020-09-30 SURGERY — CAROTID PTA/STENT INTERVENTION
Anesthesia: Moderate Sedation | Laterality: Left

## 2020-09-30 MED ORDER — FAMOTIDINE 20 MG PO TABS: 40.0000 mg | ORAL_TABLET | Freq: Once | ORAL | Status: DC | PRN

## 2020-09-30 MED ORDER — MAGNESIUM SULFATE 2 GM/50ML IV SOLN
2.0000 g | Freq: Every day | INTRAVENOUS | Status: DC | PRN
Start: 2020-09-30 — End: 2020-10-01
  Filled 2020-09-30: qty 50

## 2020-09-30 MED ORDER — MORPHINE SULFATE (PF) 4 MG/ML IV SOLN: 2.0000 mg | INTRAVENOUS | Status: DC | PRN

## 2020-09-30 MED ORDER — ATROPINE SULFATE 1 MG/10ML IJ SOSY
PREFILLED_SYRINGE | INTRAMUSCULAR | Status: DC | PRN
  Administered 2020-09-30: .5 mg via INTRAVENOUS

## 2020-09-30 MED ORDER — FENTANYL CITRATE PF 50 MCG/ML IJ SOSY
PREFILLED_SYRINGE | INTRAMUSCULAR | Status: AC
  Filled 2020-09-30: qty 1

## 2020-09-30 MED ORDER — CEFAZOLIN SODIUM-DEXTROSE 2-4 GM/100ML-% IV SOLN
INTRAVENOUS | Status: AC
  Administered 2020-09-30: 2 g via INTRAVENOUS
  Filled 2020-09-30: qty 100

## 2020-09-30 MED ORDER — HYDROMORPHONE HCL 1 MG/ML IJ SOLN: 1.0000 mg | Freq: Once | INTRAMUSCULAR | Status: DC | PRN

## 2020-09-30 MED ORDER — METHYLPREDNISOLONE SODIUM SUCC 125 MG IJ SOLR: 125.0000 mg | Freq: Once | INTRAMUSCULAR | Status: DC | PRN

## 2020-09-30 MED ORDER — MIDAZOLAM HCL 2 MG/2ML IJ SOLN
INTRAMUSCULAR | Status: AC
  Filled 2020-09-30: qty 2

## 2020-09-30 MED ORDER — HEPARIN SODIUM (PORCINE) 1000 UNIT/ML IJ SOLN
INTRAMUSCULAR | Status: AC
  Filled 2020-09-30: qty 2

## 2020-09-30 MED ORDER — PHENYLEPHRINE HCL-NACL 20-0.9 MG/250ML-% IV SOLN
INTRAVENOUS | Status: AC
  Filled 2020-09-30: qty 250

## 2020-09-30 MED ORDER — ALUM & MAG HYDROXIDE-SIMETH 200-200-20 MG/5ML PO SUSP: 15.0000 mL | ORAL | Status: DC | PRN

## 2020-09-30 MED ORDER — ATORVASTATIN CALCIUM 20 MG PO TABS
40.0000 mg | ORAL_TABLET | Freq: Every day | ORAL | Status: DC
  Administered 2020-09-30: 40 mg via ORAL
  Filled 2020-09-30: qty 2

## 2020-09-30 MED ORDER — SODIUM CHLORIDE 0.9 % IV SOLN: 500.0000 mL | Freq: Once | INTRAVENOUS | Status: DC | PRN

## 2020-09-30 MED ORDER — SODIUM CHLORIDE FLUSH 0.9 % IV SOLN
INTRAVENOUS | Status: AC
  Filled 2020-09-30: qty 10

## 2020-09-30 MED ORDER — TAMSULOSIN HCL 0.4 MG PO CAPS
0.4000 mg | ORAL_CAPSULE | Freq: Every day | ORAL | Status: DC
  Administered 2020-10-01: 0.4 mg via ORAL
  Filled 2020-09-30 (×2): qty 1

## 2020-09-30 MED ORDER — CLOPIDOGREL BISULFATE 75 MG PO TABS
75.0000 mg | ORAL_TABLET | Freq: Every day | ORAL | Status: DC
  Administered 2020-10-01: 75 mg via ORAL
  Filled 2020-09-30: qty 1

## 2020-09-30 MED ORDER — OXYCODONE-ACETAMINOPHEN 5-325 MG PO TABS: 1.0000 | ORAL_TABLET | ORAL | Status: DC | PRN

## 2020-09-30 MED ORDER — ONDANSETRON HCL 4 MG/2ML IJ SOLN: 4.0000 mg | Freq: Four times a day (QID) | INTRAMUSCULAR | Status: DC | PRN

## 2020-09-30 MED ORDER — LABETALOL HCL 5 MG/ML IV SOLN
10.0000 mg | INTRAVENOUS | Status: DC | PRN
Start: 2020-09-30 — End: 2020-10-01

## 2020-09-30 MED ORDER — ASPIRIN EC 81 MG PO TBEC
81.0000 mg | DELAYED_RELEASE_TABLET | Freq: Every day | ORAL | Status: DC
  Administered 2020-10-01: 81 mg via ORAL
  Filled 2020-09-30: qty 1

## 2020-09-30 MED ORDER — MIDAZOLAM HCL 2 MG/2ML IJ SOLN
INTRAMUSCULAR | Status: DC | PRN
  Administered 2020-09-30 (×2): 1 mg via INTRAVENOUS

## 2020-09-30 MED ORDER — PHENYLEPHRINE HCL (PRESSORS) 10 MG/ML IV SOLN
INTRAVENOUS | Status: AC
  Filled 2020-09-30: qty 1

## 2020-09-30 MED ORDER — FENTANYL CITRATE (PF) 100 MCG/2ML IJ SOLN
INTRAMUSCULAR | Status: DC | PRN
  Administered 2020-09-30: 50 ug via INTRAVENOUS

## 2020-09-30 MED ORDER — SODIUM CHLORIDE 0.9 % IV SOLN: INTRAVENOUS | Status: DC

## 2020-09-30 MED ORDER — METOPROLOL TARTRATE 5 MG/5ML IV SOLN: 2.0000 mg | INTRAVENOUS | Status: DC | PRN

## 2020-09-30 MED ORDER — GUAIFENESIN-DM 100-10 MG/5ML PO SYRP: 15.0000 mL | ORAL_SOLUTION | ORAL | Status: DC | PRN

## 2020-09-30 MED ORDER — HYDRALAZINE HCL 20 MG/ML IJ SOLN: 5.0000 mg | INTRAMUSCULAR | Status: DC | PRN

## 2020-09-30 MED ORDER — HEPARIN SODIUM (PORCINE) 1000 UNIT/ML IJ SOLN
INTRAMUSCULAR | Status: DC | PRN
  Administered 2020-09-30: 6000 [IU] via INTRAVENOUS
  Administered 2020-09-30: 2000 [IU] via INTRAVENOUS

## 2020-09-30 MED ORDER — POTASSIUM CHLORIDE CRYS ER 20 MEQ PO TBCR: 20.0000 meq | EXTENDED_RELEASE_TABLET | Freq: Every day | ORAL | Status: DC | PRN

## 2020-09-30 MED ORDER — MIDAZOLAM HCL 2 MG/ML PO SYRP: 8.0000 mg | ORAL_SOLUTION | Freq: Once | ORAL | Status: DC | PRN

## 2020-09-30 MED ORDER — DIPHENHYDRAMINE HCL 50 MG/ML IJ SOLN: 50.0000 mg | Freq: Once | INTRAMUSCULAR | Status: DC | PRN

## 2020-09-30 MED ORDER — PHENOL 1.4 % MT LIQD
1.0000 | OROMUCOSAL | Status: DC | PRN
  Filled 2020-09-30: qty 177

## 2020-09-30 MED ORDER — SODIUM CHLORIDE 0.9 % IV SOLN
INTRAVENOUS | Status: DC
Start: 2020-09-30 — End: 2020-09-30

## 2020-09-30 MED ORDER — CHLORHEXIDINE GLUCONATE CLOTH 2 % EX PADS
6.0000 | MEDICATED_PAD | Freq: Every day | CUTANEOUS | Status: DC
  Administered 2020-09-30: 6 via TOPICAL

## 2020-09-30 MED ORDER — ACETAMINOPHEN 325 MG RE SUPP
325.0000 mg | RECTAL | Status: DC | PRN
  Filled 2020-09-30: qty 2

## 2020-09-30 MED ORDER — CEFAZOLIN SODIUM-DEXTROSE 2-4 GM/100ML-% IV SOLN: 2.0000 g | Freq: Once | INTRAVENOUS | Status: AC

## 2020-09-30 MED ORDER — CEFAZOLIN SODIUM-DEXTROSE 2-4 GM/100ML-% IV SOLN
2.0000 g | Freq: Three times a day (TID) | INTRAVENOUS | Status: AC
  Administered 2020-09-30 – 2020-10-01 (×2): 2 g via INTRAVENOUS
  Filled 2020-09-30 (×2): qty 100

## 2020-09-30 MED ORDER — DILTIAZEM HCL ER COATED BEADS 120 MG PO CP24
120.0000 mg | ORAL_CAPSULE | Freq: Every day | ORAL | Status: DC
  Administered 2020-10-01: 120 mg via ORAL
  Filled 2020-09-30: qty 1

## 2020-09-30 MED ORDER — PHENYLEPHRINE 40 MCG/ML (10ML) SYRINGE FOR IV PUSH (FOR BLOOD PRESSURE SUPPORT)
PREFILLED_SYRINGE | INTRAVENOUS | Status: AC
  Filled 2020-09-30: qty 10

## 2020-09-30 MED ORDER — FAMOTIDINE 20 MG IN NS 100 ML IVPB
20.0000 mg | Freq: Two times a day (BID) | INTRAVENOUS | Status: DC
  Administered 2020-09-30: 20 mg via INTRAVENOUS
  Filled 2020-09-30: qty 100

## 2020-09-30 MED ORDER — ACETAMINOPHEN 325 MG PO TABS: 325.0000 mg | ORAL_TABLET | ORAL | Status: DC | PRN

## 2020-09-30 SURGICAL SUPPLY — 24 items
BALLN VIATRAC 5X20X135 (BALLOONS) ×2
BALLOON VIATRAC 5X20X135 (BALLOONS) ×1 IMPLANT
CATH ANGIO 5F PIGTAIL 100CM (CATHETERS) ×2 IMPLANT
CATH HEADHUNTER H1 5F 100CM (CATHETERS) ×2 IMPLANT
CATH MICROCATH PRGRT 2.8F 130 (MICROCATHETER) ×1 IMPLANT
COIL 400 COMPLEX SOFT 2X4CM (Vascular Products) ×4 IMPLANT
COIL 400 COMPLEX SOFT 3X5CM (Vascular Products) ×2 IMPLANT
COVER PROBE U/S 5X48 (MISCELLANEOUS) ×2 IMPLANT
DEVICE EMBOSHIELD NAV6 4.0-7.0 (FILTER) ×2 IMPLANT
DEVICE SAFEGUARD 24CM (GAUZE/BANDAGES/DRESSINGS) ×4 IMPLANT
DEVICE STARCLOSE SE CLOSURE (Vascular Products) ×2 IMPLANT
DEVICE TORQUE .025-.038 (MISCELLANEOUS) ×2 IMPLANT
GLIDEWIRE ANGLED SS 035X260CM (WIRE) ×2 IMPLANT
GUIDEWIRE VASC STIFF .038X260 (WIRE) ×2 IMPLANT
HANDLE DETACHMENT COIL (MISCELLANEOUS) ×2 IMPLANT
KIT CAROTID MANIFOLD (MISCELLANEOUS) ×2 IMPLANT
KIT ENCORE 26 ADVANTAGE (KITS) ×2 IMPLANT
MICROCATH PROGREAT 2.8F 130CM (MICROCATHETER) ×2
PACK ANGIOGRAPHY (CUSTOM PROCEDURE TRAY) ×2 IMPLANT
SHEATH BRITE TIP 5FRX11 (SHEATH) ×2 IMPLANT
SHEATH GUIDING CAROTID 6FRX90 (SHEATH) ×2 IMPLANT
STENT XACT CAR 10-8X40X136 (Permanent Stent) ×2 IMPLANT
SYR MEDRAD MARK 7 150ML (SYRINGE) ×2 IMPLANT
WIRE GUIDERIGHT .035X150 (WIRE) ×2 IMPLANT

## 2020-09-30 NOTE — Op Note (Signed)
OPERATIVE NOTE DATE: 09/30/2020  PROCEDURE:  Ultrasound guidance for vascular access right femoral artery  Placement of a 10 mm proximal, 8 mm distal 4 cm long Exact stent with the use of the NAV-6 embolic protection device in the left carotid artery Catheter placement into right maxillary branch of the right external carotid artery with selective angiogram of the maxillary vessel and the external carotid artery. Coil embolization of the maxillary branch of the right external carotid artery with a total of 3 Ruby coils  PRE-OPERATIVE DIAGNOSIS: 1.  Recent stroke with left carotid artery stenosis. 2.  Intractable nosebleeds requiring Rhino Rocket and cessation of anticoagulation  POST-OPERATIVE DIAGNOSIS:  Same as above  SURGEON: Festus Barren, MD  ASSISTANT(S): None  ANESTHESIA: local/MCS  ESTIMATED BLOOD LOSS:  25 cc  CONTRAST: 130 cc  FLUORO TIME: 13.7 minutes  MODERATE CONSCIOUS SEDATION TIME:  Approximately 64 minutes using 2 mg of Versed and 50 mcg of Fentanyl  FINDING(S): 1.   60 to 65% left internal carotid artery stenosis  SPECIMEN(S):   none  INDICATIONS:   Patient is a 80 y.o. male who presents with recent stroke and a left carotid artery stenosis by duplex.  The patient also has intractable nosebleeds and has required continuous pressure with a Rhino Rocket and cessation of anticoagulation from the right nostril.  Risks and benefits were discussed and informed consent was obtained.   DESCRIPTION: After obtaining full informed written consent, the patient was brought back to the vascular suite and placed supine upon the table.  The patient received IV antibiotics prior to induction. Moderate conscious sedation was administered during a face to face encounter with the patient throughout the procedure with my supervision of the RN administering medicines and monitoring the patients vital signs and mental status throughout from the start of the procedure until the patient  was taken to the recovery room.  After obtaining adequate anesthesia, the patient was prepped and draped in the standard fashion.   The right femoral artery was visualized with ultrasound and found to be widely patent. It was then accessed under direct ultrasound guidance without difficulty with a Seldinger needle. A permanent image was recorded. A J-wire was placed and we then placed a 6 French sheath. The patient was then heparinized and a total of 8000 units of intravenous heparin were given and an ACT was checked to confirm successful anticoagulation. A pigtail catheter was then placed into the ascending aorta. This showed a type IIb aortic arch with no proximal stenosis in the great vessels. I then selectively cannulated the left common carotid artery without difficulty with a headhunter catheter and advanced into the mid left common carotid artery.  Cervical and cerebral carotid angiography was then performed. There were no obvious intracranial filling defects with decent flow. The carotid bifurcation demonstrated tandem stenosis 1 at the origin of the internal carotid artery and then another stenosis about a centimeter and a half beyond the origin that was in the 60 to 65% range.  I then advanced into the external carotid artery with a Glidewire and the headhunter catheter and then exchanged for the Amplatz Super Stiff wire. Over the Amplatz Super Stiff wire, a 6 [place] shuttle sheath was placed into the mid common carotid artery. I then used the NAV-6  Embolic protection device and crossed the lesion and parked this in the distal internal carotid artery at the base of the skull.  I then selected a 10 mm proximal, 8 mm distal, 4  cm long exact stent. This was deployed across the lesion encompassing it in its entirety. A 5 mm diameter by 2 cm length length balloon was used to post dilate the stent. Only about a 10-15% residual stenosis was present after angioplasty. Completion angiogram showed normal  intracranial filling without new defects.  I then turned my attention to the right carotid artery with intention to embolized the maxillary branch of the right external carotid artery.  Going back to the LAO projection, a headhunter catheter was used to selectively cannulate the innominate artery and advanced into the right common carotid artery.  The carotid bifurcation was fairly normal with minimal disease in the internal carotid artery.  Using the [place] and the headhunter catheter was able to cannulate the external carotid artery and perform selective imaging to identify the maxillary branch and then follow this out as it supplied the right nostril.  Using a prograde microcatheter I was able to tediously cannulate out within about 4 to 5 cm of the nose and this maxillary branch going to an area which supplied essentially all of the arterial blood flow to the nose on this right side.  Once right at this point selective imaging demonstrated good location, I used a total of 3 Ruby coils to selectively embolize this vessel.  The first 2 Ruby coils were 2 mm in diameter by 4 cm in length and the last coil was 3 millimeters in diameter by 5 cm in length.  Following this, the artery was well packed and no other artery seem to be feeding the nose.  The prograde microcatheter and diagnostic catheter were then removed.   At this point I elected to terminate the procedure. The sheath was removed and Star[employer] closure device was deployed in the right femoral artery with excellent hemostatic result. The patient was taken to the recovery room in stable condition having tolerated the procedure well.  COMPLICATIONS: none  CONDITION: stable  Festus Barren 09/30/2020 3:02 PM   This note was created with Dragon Medical transcription system. Any errors in dictation are purely unintentional.

## 2020-09-30 NOTE — Interval H&P Note (Signed)
History and Physical Interval Note:  09/30/2020 12:52 PM  Ethan Ayers  has presented today for surgery, with the diagnosis of LT Carotid Stent   LT Carotid Artery Stenosis   ABBOTT Rep  cc: Loni Muse.  The various methods of treatment have been discussed with the patient and family. After consideration of risks, benefits and other options for treatment, the patient has consented to  Procedure(s): CAROTID PTA/STENT INTERVENTION (Left) as a surgical intervention.  [name] history has been reviewed, patient examined, no change in status, stable for surgery.  I have reviewed the patient's chart and labs.  Questions were answered to the patient's satisfaction.     Festus Barren

## 2020-09-30 NOTE — Progress Notes (Signed)
Pt received from Cath lab post L carotid stenting. VSS RA. R groin PAD - level 1 minimal ooze on site noted. RLE warm, palpable pulses (femoral, DP, PT). No complaints of pain verbalized. Per order, pt allowed to dangle at the edge of the bed and stand as tolerated after 2hr bedrest. When pt stood up to try and use the urinal, slight oozing noted from R groin - area marked, pt informed to avoid standing up overnight. No further bleeding noted after.  Rhino rocket left in R nares, awaiting ENT orders, no active epistaxis noted.

## 2020-10-01 ENCOUNTER — Encounter: Payer: Self-pay | Admitting: Vascular Surgery

## 2020-10-01 LAB — CBC
HCT: 29 % — ABNORMAL LOW (ref 39.0–52.0)
Hemoglobin: 9.7 g/dL — ABNORMAL LOW (ref 13.0–17.0)
MCH: 31.1 pg (ref 26.0–34.0)
MCHC: 33.4 g/dL (ref 30.0–36.0)
MCV: 92.9 fL (ref 80.0–100.0)
Platelets: 237 10*3/uL (ref 150–400)
RBC: 3.12 MIL/uL — ABNORMAL LOW (ref 4.22–5.81)
RDW: 14.5 % (ref 11.5–15.5)
WBC: 9.6 10*3/uL (ref 4.0–10.5)
nRBC: 0 % (ref 0.0–0.2)

## 2020-10-01 LAB — BASIC METABOLIC PANEL
Anion gap: 5 (ref 5–15)
BUN: 21 mg/dL (ref 8–23)
CO2: 25 mmol/L (ref 22–32)
Calcium: 8.3 mg/dL — ABNORMAL LOW (ref 8.9–10.3)
Chloride: 108 mmol/L (ref 98–111)
Creatinine, Ser: 1.21 mg/dL (ref 0.61–1.24)
GFR, Estimated: 60 mL/min — ABNORMAL LOW (ref >60)
Glucose, Bld: 102 mg/dL — ABNORMAL HIGH (ref 70–99)
Potassium: 3.4 mmol/L — ABNORMAL LOW (ref 3.5–5.1)
Sodium: 138 mmol/L (ref 135–145)

## 2020-10-01 LAB — GLUCOSE, CAPILLARY: Glucose-Capillary: 99 mg/dL (ref 70–99)

## 2020-10-01 MED ORDER — ASPIRIN 81 MG PO TBEC: 81.0000 mg | DELAYED_RELEASE_TABLET | Freq: Every day | ORAL | 11 refills | Status: DC

## 2020-10-01 MED ORDER — FAMOTIDINE 20 MG PO TABS
20.0000 mg | ORAL_TABLET | Freq: Two times a day (BID) | ORAL | Status: DC
  Administered 2020-10-01: 20 mg via ORAL
  Filled 2020-10-01: qty 1

## 2020-10-01 NOTE — Progress Notes (Signed)
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 Bleeding is no longer noted given the markings on the adhesive. Pulses to bilateral lower extremities (dorsalis pedis) difficult to palpate; doppler used. No epistaxis noted. Rhino Rocket still in place. Stood to urinate several times. Gait steady. Wife at the bedside. Call bell in reach. Will continue to monitor.

## 2020-10-01 NOTE — Discharge Summary (Signed)
[facility]    Discharge Summary  Patient ID:  Ethan Ayers MRN: [id number] DOB/AGE: 05/28/1940 80 y.o.  Admit date: 09/30/2020 Discharge date: 10/01/2020 Date of Surgery: 09/30/2020 Surgeon: Surgeon(s): Wyn Quaker Marlow Baars, MD  Admission Diagnosis: Carotid stenosis, symptomatic, with infarction [facility]) [I63.239] Carotid stenosis [I65.29]  Discharge Diagnoses:  Carotid stenosis, symptomatic, with infarction [facility]) [I63.239] Carotid stenosis [I65.29]  Secondary Diagnoses: Past Medical History:  Diagnosis Date   A-fib (HCC)    BPH (benign prostatic hyperplasia)    Hypertension    TIA (transient ischemic attack)    Procedure(s):  Ultrasound guidance for vascular access right femoral artery  Placement of a 10 mm proximal, 8 mm distal 4 cm long Exact stent with the use of the NAV-6 embolic protection device in the left carotid artery Catheter placement into right maxillary branch of the right external carotid artery with selective angiogram of the maxillary vessel and the external carotid artery. Coil embolization of the maxillary branch of the right external carotid artery with a total of 3 Ruby coils  Discharged Condition: good  HPI / Hospital Course:  Patient is a 80 y.o. male who presents with recent stroke and a left carotid artery stenosis by duplex.  The patient also has intractable nosebleeds and has required continuous pressure with a Rhino Rocket and cessation of anticoagulation from the right nostril.  Risks and benefits were discussed and informed consent was obtained. On 09/30/20 the patient underwent:  Ultrasound guidance for vascular access right femoral artery  Placement of a 10 mm proximal, 8 mm distal 4 cm long Exact stent with the use of the NAV-6 embolic protection device in the left carotid artery Catheter placement into right maxillary branch of the right external carotid artery with selective angiogram of the maxillary vessel and the external  carotid artery. Coil embolization of the maxillary branch of the right external carotid artery with a total of 3 Ruby coils  The patient tolerated the procedure well was transferred from the angiography suite to the ICU for observation overnight.  Night of surgery was unremarkable.  During his brief stay, his diet was advanced, he was urinating, his pain was controlled to the use of p.o. pain medication he was ambulating at baseline.  Day of discharge the patient was afebrile with stable vital signs and essentially unremarkable physical exam.  Of note: The patient does have a Rhino Rocket in the right nostril.  He is to see his ENT surgeon on Monday for removal as well as when to restart Xarelto.  Patient was discharged on Keflex as per ENTs recommendations.  Physical Exam:  Alert and oriented x3, no acute distress Face: Symmetrical, tongue midline Neck: Trachea midline Pulmonary: Clear to auscultation bilaterally Cardiovascular: Regular rate and rhythm Abdomen: Soft, nontender nondistended Access site: Clean dry and intact Extremity: Warm distally of toes Neurological: Intact  Labs: As below  Complications: None  Consults: None  Significant Diagnostic Studies: CBC Lab Results  Component Value Date   WBC 9.6 10/01/2020   HGB 9.7 (L) 10/01/2020   HCT 29.0 (L) 10/01/2020   MCV 92.9 10/01/2020   PLT 237 10/01/2020   BMET    Component Value Date/Time   NA 138 10/01/2020 0239   K 3.4 (L) 10/01/2020 0239   CL 108 10/01/2020 0239   CO2 25 10/01/2020 0239   GLUCOSE 102 (H) 10/01/2020 0239   BUN 21 10/01/2020 0239   CREATININE 1.21 10/01/2020 0239   CALCIUM 8.3 (L) 10/01/2020 0239  GFRNONAA 60 (L) 10/01/2020 0239   COAG Lab Results  Component Value Date   INR 3.6 (H) 09/22/2020   Disposition:  Discharge to :Home  Allergies as of 10/01/2020   No Known Allergies      Medication List     STOP taking these medications    rivaroxaban 20 MG Tabs tablet Commonly  known as: XARELTO       TAKE these medications    aspirin 81 MG EC tablet Take 1 tablet (81 mg total) by mouth daily at 6 (six) AM. Swallow whole. Start taking on: October 02, 2020   atorvastatin 40 MG tablet Commonly known as: LIPITOR Take 1 tablet (40 mg total) by mouth at bedtime.   cephALEXin 500 MG capsule Commonly known as: KEFLEX Take 1 capsule (500 mg total) by mouth 3 (three) times daily for 7 days.   clopidogrel 75 MG tablet Commonly known as: PLAVIX Take 1 tablet (75 mg total) by mouth daily.   diltiazem 120 MG 24 hr capsule Commonly known as: CARDIZEM CD Take 120 mg by mouth daily.   tamsulosin 0.4 MG Caps capsule Commonly known as: FLOMAX Take 0.4 mg by mouth daily.       Verbal and written Discharge instructions given to the patient. Wound care per Discharge AVS  Follow-up Information     Georgiana Spinner, NP Follow up in 2 week(s).   Specialty: Vascular Surgery Why: First post-op check. No studies. Contact information: [address] [place] [address] [phone]                Signed: Tonette Lederer, PA-C 10/01/2020, 12:03 PM

## 2020-10-01 NOTE — Progress Notes (Signed)
Patient ambulated around unit with no complications. No shortness of breath or pain. Groin is  intact with no bleeding or hematoma. Pedal pulses plus 1 with doppler. Patient tolerating diet and urinating.

## 2020-10-01 NOTE — Discharge Instructions (Signed)
1) you may shower as of tomorrow.  Please keep your groins clean and dry. 2) please do not engage in strenuous activity or lifting greater than 10 pounds until you are cleared at your first postoperative follow-up 3) please do not drive for 2 weeks 4) we are going to hold your Xarelto.  Defer restarting blood thinner to ENT surgeon.

## 2020-10-01 NOTE — Progress Notes (Signed)
Patient alert and oriented. On room air, no complaints of shortness of breath. No complaints of pain. Patient tolerating diet and using urinal. Patient ambulated around unit with no complications. Groin intact with no bleeding or hematoma. Pulses can be dopplered. Removed IV's and help patient get dressed. Follow up appoint with NP brown in 2 weeks. Follow up appointment with ENT on Monday. Patient and wife had no further questions. Patient being discharged home.

## 2020-10-01 NOTE — Progress Notes (Signed)
PHARMACIST - PHYSICIAN COMMUNICATION  CONCERNING: IV to Oral Route Change Policy  RECOMMENDATION: This patient is receiving famotidine by the intravenous route.  Based on criteria approved by the Pharmacy and Therapeutics Committee, the intravenous medication(s) is/are being converted to the equivalent oral dose form(s).   DESCRIPTION: These criteria include: The patient is eating (either orally or via tube) and/or has been taking other orally administered medications for a least 24 hours The patient has no evidence of active gastrointestinal bleeding or impaired GI absorption (gastrectomy, short bowel, patient on TNA or NPO).  If you have questions about this conversion, please contact the Pharmacy Department    Tressie Ellis, [facility] 10/01/2020 8:50 AM

## 2020-10-02 NOTE — H&P (Signed)
[facility] Admission History & Physical  MRN : [id number]  Ethan Ayers is a 80 y.o. (Mar 25, 1940) male who presents with chief complaint of No chief complaint on file. [name]  History of Present Illness: Patient presents today for carotid angiogram and possible intervention on the left.  Also, he has had significant nosebleeds and we are asked by ENT to consider embolization of his maxillary branch of the external carotid artery on the right.  He has a [employer] in place from his nosebleed and ER visit a few days ago.  No current facility-administered medications for this encounter.   Current Outpatient Medications  Medication Sig Dispense Refill   atorvastatin (LIPITOR) 40 MG tablet Take 1 tablet (40 mg total) by mouth at bedtime. 30 tablet 0   cephALEXin (KEFLEX) 500 MG capsule Take 1 capsule (500 mg total) by mouth 3 (three) times daily for 7 days. 21 capsule 0   clopidogrel (PLAVIX) 75 MG tablet Take 1 tablet (75 mg total) by mouth daily. 30 tablet 0   diltiazem (CARDIZEM CD) 120 MG 24 hr capsule Take 120 mg by mouth daily.     tamsulosin (FLOMAX) 0.4 MG CAPS capsule Take 0.4 mg by mouth daily.     aspirin EC 81 MG EC tablet Take 1 tablet (81 mg total) by mouth daily at 6 (six) AM. Swallow whole. 30 tablet 11    Past Medical History:  Diagnosis Date   A-fib [facility])    BPH (benign prostatic hyperplasia)    Hypertension    TIA (transient ischemic attack)     Past Surgical History:  Procedure Laterality Date   CAROTID PTA/STENT INTERVENTION Left 09/30/2020   Procedure: CAROTID PTA/STENT INTERVENTION;  Surgeon: Annice Needy, MD;  Location: [facility] INVASIVE CV LAB;  Service: Cardiovascular;  Laterality: Left;   TONSILLECTOMY       Social History   Tobacco Use   Smoking status: Former    Types: Cigarettes    Quit date: 01/18/1976    Years since quitting: 44.7   Smokeless tobacco: Never  Substance Use Topics   Alcohol use: No   Drug use: No      History reviewed. No pertinent family history.  No Known Allergies   REVIEW OF SYSTEMS (Negative unless checked)  Constitutional: Weight loss  Fever  Chills Cardiac: Chest pain   Chest pressure   Palpitations   Shortness of breath when laying flat   Shortness of breath at rest   Shortness of breath with exertion. Vascular:  Pain in legs with walking   Pain in legs at rest   Pain in legs when laying flat   Claudication   Pain in feet when walking  Pain in feet at rest  Pain in feet when laying flat   History of DVT   Phlebitis   Swelling in legs   Varicose veins   Non-healing ulcers Pulmonary:   Uses home oxygen   Productive cough   Hemoptysis   Wheeze  COPD   Asthma Neurologic:  Dizziness  Blackouts   Seizures   History of stroke   History of TIA  Aphasia   Temporary blindness   Dysphagia   Weakness or numbness in arms   Weakness or numbness in legs Musculoskeletal:  Arthritis   Joint swelling   Joint pain   Low back pain Hematologic:  Easy bruising  Easy bleeding   Hypercoagulable state   Anemic  Hepatitis Gastrointestinal:  Blood in stool     Vomiting blood  [] Gastroesophageal reflux/heartburn   [] Difficulty swallowing. Genitourinary:  [] Chronic kidney disease   [] Difficult urination  [] Frequent urination  [] Burning with urination   [] Blood in urine Skin:  [] Rashes   [] Ulcers   [] Wounds Psychological:  [] History of anxiety   []  History of major depression.  Physical Examination  Vitals:   10/01/20 0927 10/01/20 1000 10/01/20 1200 10/01/20 1338  BP: 133/77 129/68 (!) 151/77 130/78  Pulse: 82 (!) 58 96 95  Resp:   20 19  Temp:   98.3 F (36.8 C)   TempSrc:      SpO2: 99% 98% 100% 99%  Weight:      Height:       Body mass index is 20.97 kg/m. Gen: WD/WN, NAD Head: [place]/AT, No temporalis wasting.  Ear/Nose/Throat: Hearing grossly intact, nares w/o erythema or drainage,  oropharynx w/o Erythema/Exudate,  Eyes: Conjunctiva clear, sclera non-icteric Neck: Trachea midline.  No JVD.  Pulmonary:  Good air movement, respirations not labored, no use of accessory muscles.  Cardiac: irregular Vascular:  Vessel Right Left   Musculoskeletal: M/S 5/5 throughout.  Extremities without ischemic changes.  No deformity or atrophy.  Neurologic: Sensation grossly intact in extremities.  Symmetrical.  Speech is fluent. Motor exam as listed above. Psychiatric: Judgment intact, Mood & affect appropriate for pt's clinical situation. Dermatologic: No rashes or ulcers noted.  No cellulitis or open wounds.      CBC Lab Results  Component Value Date   WBC 9.6 10/01/2020   HGB 9.7 (L) 10/01/2020   HCT 29.0 (L) 10/01/2020   MCV 92.9 10/01/2020   PLT 237 10/01/2020    BMET    Component Value Date/Time   NA 138 10/01/2020 0239   K 3.4 (L) 10/01/2020 0239   CL 108 10/01/2020 0239   CO2 25 10/01/2020 0239   GLUCOSE 102 (H) 10/01/2020 0239   BUN 21 10/01/2020 0239   CREATININE 1.21 10/01/2020 0239   CALCIUM 8.3 (L) 10/01/2020 0239   GFRNONAA 60 (L) 10/01/2020 0239   Estimated Creatinine Clearance: 45.4 mL/min (by C-G formula based on SCr of 1.21 mg/dL).  COAG Lab Results  Component Value Date   INR 3.6 (H) 09/22/2020    Radiology MR ANGIO HEAD WO CONTRAST  Result Date: 09/23/2020 CLINICAL DATA:  80 year old male code stroke presentation. Altered mental status and slurred speech. EXAM: MRA HEAD WITHOUT CONTRAST TECHNIQUE: Angiographic images of the Circle of Willis were acquired using MRA technique without intravenous contrast. COMPARISON:  Brain MRI today. FINDINGS: Antegrade flow in the distal left vertebral artery without stenosis to the vertebrobasilar junction. Left PICA origin is normal. However, severely attenuated flow signal in the distal right vertebral artery which appears highly stenotic although might remain patent (series 1036, image 11). Basilar artery  is patent with mild irregularity but no significant stenosis. Bilateral AICA, SCA, and PCA origins are patent. Posterior communicating arteries are diminutive or absent. There is mild irregularity and stenosis at the left PCA origin. Other left PCA branches are within normal limits. There is mild to moderate right PCA P2 irregularity and stenosis. Other right PCA branches are within normal limits. Antegrade flow in both ICA siphons. Tortuous distal cervical left ICA. Mild to moderate cavernous and supraclinoid ICA irregularity in keeping with atherosclerosis, but only mild right ICA supraclinoid stenosis occurs. Ophthalmic artery origins are within normal limits. Patent carotid termini, MCA and ACA origins. Mild irregularity at the right ACA origin without definite stenosis. Anterior communicating artery appears normal. Visible ACA  branches are within normal limits. Left MCA M1 segment bifurcates early without stenosis. Visible left MCA branches are within normal limits. Right MCA M1 segment also bifurcates early with mild irregularity but no significant stenosis. Visible right MCA branches appear patent with mild irregularity. IMPRESSION: 1. Severely stenotic distal Right Vertebral Artery, although it might remain patent to the Basilar. 2. Other anterior and posterior circulation atherosclerosis, but with generally mild stenoses. Up to moderate stenosis of the right PCA P2 segment. Electronically Signed   By: Odessa Fleming M.D.   On: 09/23/2020 05:03   MR BRAIN WO CONTRAST  Result Date: 09/23/2020 CLINICAL DATA:  80 year old male code stroke presentation. Altered mental status and slurred speech. EXAM: MRI HEAD WITHOUT CONTRAST TECHNIQUE: Multiplanar, multiecho pulse sequences of the brain and surrounding structures were obtained without intravenous contrast. COMPARISON:  Plain head CT 09/22/2020. FINDINGS: Brain: No restricted diffusion to suggest acute infarction. No midline shift, mass effect, evidence of mass  lesion, ventriculomegaly, extra-axial collection or acute intracranial hemorrhage. Cervicomedullary junction and pituitary are within normal limits. Patchy and confluent bilateral cerebral white matter T2 and FLAIR hyperintensity. Solitary chronic microhemorrhage in the left lower parietal lobe series 13, image 36. No cortical encephalomalacia or other chronic cerebral blood products. Deep gray matter nuclei appear within normal limits. There is mild T2 heterogeneity in the pons. Cerebellum is within normal limits. Vascular: Distal right vertebral artery flow void is lost on series 10, image 5. But the other major intracranial vascular flow voids are preserved. See intracranial MRA reported separately. Skull and upper cervical spine: Negative. Normal bone marrow signal. Sinuses/Orbits: Postoperative changes to both globes, otherwise negative orbits. Paranasal sinuses and mastoids are stable and well aerated. Other: Visible internal auditory structures appear normal. Negative visible scalp and face. IMPRESSION: 1. No acute intracranial abnormality. 2. Evidence of poor flow versus occlusion of the distal right vertebral artery, see Intracranial MRA reported separately. 3. Moderate for age signal changes in the cerebral white matter with a solitary chronic microhemorrhage in the left hemisphere. Favor chronic small vessel disease. Electronically Signed   By: Odessa Fleming M.D.   On: 09/23/2020 04:57   US Carotid Bilateral  Result Date: 09/23/2020 CLINICAL DATA:  80 year old male with a history of stroke symptoms EXAM: BILATERAL CAROTID DUPLEX ULTRASOUND TECHNIQUE: Wallace Cullens scale imaging, color Doppler and duplex ultrasound were performed of bilateral carotid and vertebral arteries in the neck. COMPARISON:  None. FINDINGS: Criteria: Quantification of carotid stenosis is based on velocity parameters that correlate the residual internal carotid diameter with NASCET-based stenosis levels, using the diameter of the distal internal  carotid lumen as the denominator for stenosis measurement. The following velocity measurements were obtained: RIGHT ICA:  Systolic 71 cm/sec, Diastolic 21 cm/sec CCA:  64 cm/sec SYSTOLIC ICA/CCA RATIO:  1.1 ECA:  73 cm/sec LEFT ICA:  Systolic 140 cm/sec, Diastolic 39 cm/sec CCA:  62 cm/sec SYSTOLIC ICA/CCA RATIO:  2.3 ECA:  91 cm/sec Right Brachial SBP: Not acquired Left Brachial SBP: Not acquired RIGHT CAROTID ARTERY: No significant calcified disease of the right common carotid artery. Intermediate waveform maintained. Heterogeneous plaque without significant calcifications at the right carotid bifurcation. Low resistance waveform of the right ICA. No significant tortuosity. RIGHT VERTEBRAL ARTERY: Antegrade flow with low resistance waveform. LEFT CAROTID ARTERY: No significant calcified disease of the left common carotid artery. Intermediate waveform maintained. Heterogeneous plaque at the left carotid bifurcation without significant calcifications. Low resistance waveform of the left ICA. LEFT VERTEBRAL ARTERY:  Antegrade flow with low resistance  waveform. IMPRESSION: Right: Color duplex indicates moderate heterogeneous plaque with no hemodynamically significant stenosis by duplex criteria in the extracranial cerebrovascular circulation. Left: Heterogeneous plaque at the left carotid bifurcation contributes to 50%-69% stenosis by established duplex criteria. Signed, Yvone Neu. Reyne Dumas, RPVI [facility] Radiology Electronically Signed   By: Gilmer Mor D.O.   On: 09/23/2020 12:13   PERIPHERAL VASCULAR CATHETERIZATION  Result Date: 09/30/2020 See surgical note for result.  EEG adult  Result Date: 09/23/2020 Charlsie Quest, MD     09/23/2020  3:22 PM Patient Name: Ethan Ayers MRN: [id number] Epilepsy Attending: Charlsie Quest Referring Physician/Provider: Dr Brooke Dare Date: 09/23/2020 Duration: 25.16 mins Patient history: 80yo M with history of  speech impairment, possible right arm tingling. EEG to evaluate for seizure Level of alertness: Awake, asleep AEDs during EEG study: None Technical aspects: This EEG study was done with scalp electrodes positioned according to the 10-20 International system of electrode placement. Electrical activity was acquired at a sampling rate of  and reviewed with a high frequency filter of  and a low frequency filter of . EEG data were recorded continuously and digitally stored. Description: The posterior dominant rhythm consists of  activity of moderate voltage (25-35 uV) seen predominantly in posterior head regions, symmetric and reactive to eye opening and eye closing. Sleep was characterized by vertex waves, sleep spindles (12 to 14 Hz), maximal frontocentral region.  Intermittent left frontotemporal 3 to  theta- delta slowing was also noted.  Physiologic photic driving was not seen during photic stimulation.  Hyperventilation was not performed.   ABNORMALITY - Intermittent slow, left frontotemporal region IMPRESSION: This study is suggestive of nonspecific cortical dysfunction in left frontotemporal region.  No seizures or epileptiform discharges were seen throughout the recording. Charlsie Quest   ECHOCARDIOGRAM COMPLETE  Result Date: 09/23/2020    ECHOCARDIOGRAM REPORT   Patient Name:   Ethan Ayers Taubman Date of Exam: 09/23/2020 Medical Rec #:  [id number]      Height:       71.0 in Accession #:    [id number]     Weight:       233.2 lb Date of Birth:  Jun 15, 1940      BSA:          2.251 m Patient Age:    80 years       BP:           146/88 mmHg Patient Gender: M              HR:           100 bpm. Exam Location:  [facility] Procedure: 2D Echo, Color Doppler and Cardiac Doppler Indications:     G45.9 TIA  History:         Patient has no prior history of Echocardiogram examinations.                  Arrythmias:Atrial Fibrillation; Risk Factors:Hypertension. Pt                  tested positive for COVID-19 on  01/13/20.  Sonographer:     Humphrey Rolls Referring Phys:  [id number] JAN A MANSY Diagnosing Phys: Sena Slate  Sonographer Comments: No subcostal window. Image acquisition challenging due to respiratory motion. IMPRESSIONS  1. Left ventricular ejection fraction, by estimation, is 55 to 60%. The left ventricle has normal function. The left ventricle has no regional wall motion abnormalities. Left ventricular diastolic parameters are  consistent with Grade I diastolic dysfunction (impaired relaxation).  2. Right ventricular systolic function is normal. The right ventricular size is normal.  3. Left atrial size was moderately dilated.  4. Right atrial size was mildly dilated.  5. The mitral valve is normal in structure. Mild mitral valve regurgitation. No evidence of mitral stenosis.  6. The aortic valve is tricuspid. Aortic valve regurgitation is not visualized. Mild aortic valve sclerosis is present, with no evidence of aortic valve stenosis. FINDINGS  Left Ventricle: Left ventricular ejection fraction, by estimation, is 55 to 60%. The left ventricle has normal function. The left ventricle has no regional wall motion abnormalities. The left ventricular internal cavity size was normal in size. There is  no left ventricular hypertrophy. Left ventricular diastolic parameters are consistent with Grade I diastolic dysfunction (impaired relaxation). Right Ventricle: The right ventricular size is normal. No increase in right ventricular wall thickness. Right ventricular systolic function is normal. Left Atrium: Left atrial size was moderately dilated. Right Atrium: Right atrial size was mildly dilated. Pericardium: There is no evidence of pericardial effusion. Mitral Valve: The mitral valve is normal in structure. Mild mitral valve regurgitation. No evidence of mitral valve stenosis. MV peak gradient, 5.2 mmHg. The mean mitral valve gradient is 2.0 mmHg. Tricuspid Valve: The tricuspid valve is normal in structure. Tricuspid valve  regurgitation is mild. Aortic Valve: The aortic valve is tricuspid. Aortic valve regurgitation is not visualized. Mild aortic valve sclerosis is present, with no evidence of aortic valve stenosis. Aortic valve mean gradient measures 4.0 mmHg. Aortic valve peak gradient measures 8.6 mmHg. Aortic valve area, by VTI measures 1.95 cm. Pulmonic Valve: The pulmonic valve was not well visualized. Pulmonic valve regurgitation is not visualized. Aorta: The aortic root is normal in size and structure. Venous: The inferior vena cava was not well visualized. IAS/Shunts: The interatrial septum was not assessed.  LEFT VENTRICLE PLAX 2D LVIDd:         5.00 cm  Diastology LVIDs:         3.20 cm  LV e' medial:    12.50 cm/s LV PW:         0.80 cm  LV E/e' medial:  7.3 LV IVS:        0.70 cm  LV e' lateral:   16.20 cm/s LVOT diam:     2.10 cm  LV E/e' lateral: 5.6 LV SV:         47 LV SV Index:   21 LVOT Area:     3.46 cm  RIGHT VENTRICLE RV Basal diam:  3.50 cm LEFT ATRIUM             Index       RIGHT ATRIUM           Index LA diam:        3.70 cm 1.64 cm/m  RA Area:     17.50 cm LA Vol (A2C):   90.6 ml 40.25 ml/m RA Volume:   43.70 ml  19.41 ml/m LA Vol (A4C):   90.7 ml 40.29 ml/m LA Biplane Vol: 91.9 ml 40.83 ml/m  AORTIC VALVE                   PULMONIC VALVE AV Area (Vmax):    1.81 cm    PV Vmax:       0.90 m/s AV Area (Vmean):   2.02 cm    PV Vmean:      52.000 cm/s AV Area (VTI):  1.95 cm    PV VTI:        0.148 m AV Vmax:           147.00 cm/s PV Peak grad:  3.3 mmHg AV Vmean:          97.900 cm/s PV Mean grad:  1.0 mmHg AV VTI:            0.243 m AV Peak Grad:      8.6 mmHg AV Mean Grad:      4.0 mmHg LVOT Vmax:         77.00 cm/s LVOT Vmean:        57.000 cm/s LVOT VTI:          0.137 m LVOT/AV VTI ratio: 0.56  AORTA Ao Root diam: 3.40 cm MITRAL VALVE               TRICUSPID VALVE MV Area (PHT): 4.29 cm    TR Peak grad:   28.1 mmHg MV Area VTI:   3.31 cm    TR Vmax:        265.00 cm/s MV Peak grad:  5.2  mmHg MV Mean grad:  2.0 mmHg    SHUNTS MV Vmax:       1.14 m/s    Systemic VTI:  0.14 m MV Vmean:      60.3 cm/s   Systemic Diam: 2.10 cm MV Decel Time: 177 msec MV E velocity: 91.27 cm/s Sena Slate Electronically signed by Sena Slate Signature Date/Time: 09/23/2020/6:12:05 PM    Final    CT HEAD CODE STROKE WO CONTRAST  Result Date: 09/22/2020 CLINICAL DATA:  Code stroke. Initial evaluation for altered mental status, slurred speech. EXAM: CT HEAD WITHOUT CONTRAST TECHNIQUE: Contiguous axial images were obtained from the base of the skull through the vertex without intravenous contrast. COMPARISON:  None available. FINDINGS: Brain: Moderately advanced age-related cerebral atrophy with chronic small vessel ischemic disease. No acute intracranial hemorrhage. No visible acute large vessel territory infarct. No mass lesion, midline shift or mass effect. No hydrocephalus or extra-axial fluid collection. Vascular: No convincing hyperdense vessel. Scattered vascular calcifications noted within the carotid siphons. Skull: Scalp soft tissues and calvarium within normal limits. Sinuses/Orbits: Globes and orbital soft tissues demonstrate no acute finding. Mild scattered mucosal thickening noted within the ethmoidal air cells and maxillary sinuses. Paranasal sinuses are otherwise clear. No mastoid effusion. Other: None. ASPECTS [facility] Stroke Program Early CT Score) - Ganglionic level infarction (caudate, lentiform nuclei, internal capsule, insula, M1-M3 cortex): 7 - Supraganglionic infarction (M4-M6 cortex): 3 Total score (0-10 with 10 being normal): 10 IMPRESSION: 1. No acute intracranial infarct or other abnormality. 2. ASPECTS is 10. 3. 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 Critical Value/emergent results were called by telephone at the time of interpretation on 09/22/2020 at 9:33 pm to provider MARK QUALE , who verbally acknowledged these results. Electronically Signed   By:  Rise Mu M.D.   On: 09/22/2020 21:36     Assessment/Plan 1.  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  MRI was negative for acute stroke, but he had symptoms consistent with embolic phenomenon from left carotid stenosis.  I had a long discussion with the patient and his family regarding treatment options.  Given his likely significant stenosis with symptoms, intervention is likely recommended.  I have recommended a carotid angiogram. At this time, if stenosis of 60% or more is identified, and anatomy is appropriate for carotid stenting this will be  performed.  If the anatomy is not appropriate for carotid stenting, carotid endarterectomy be performed at a later date.  If stenosis is less than 60%, medical management will be recommended.  Risks and benefits were discussed and they are agreeable to proceed 2.  Hypertension.  Stable on outpatient medications and blood pressure control important in reducing the progression of atherosclerotic disease. On appropriate oral medications. 3.  Atrial fibrillation.  Transitioning to Xarelto bu ton hold for nosebleeds   Festus Barren, MD  10/02/2020 12:48 PM

## 2020-10-16 ENCOUNTER — Other Ambulatory Visit: Payer: Self-pay

## 2020-10-16 ENCOUNTER — Ambulatory Visit (INDEPENDENT_AMBULATORY_CARE_PROVIDER_SITE_OTHER): Payer: Medicare Other | Admitting: Nurse Practitioner

## 2020-10-16 VITALS — BP 122/71 | HR 92 | Resp 16 | Wt 156.4 lb

## 2020-10-16 DIAGNOSIS — I63239 Cerebral infarction due to unspecified occlusion or stenosis of unspecified carotid arteries: Secondary | ICD-10-CM

## 2020-10-16 MED ORDER — CLOPIDOGREL BISULFATE 75 MG PO TABS: 75.0000 mg | ORAL_TABLET | Freq: Every day | ORAL | 11 refills | Status: DC

## 2020-10-25 ENCOUNTER — Encounter (INDEPENDENT_AMBULATORY_CARE_PROVIDER_SITE_OTHER): Payer: Self-pay | Admitting: Nurse Practitioner

## 2020-10-25 NOTE — Progress Notes (Signed)
Subjective:    Patient ID: Ethan Ayers, male    DOB: [birth year], 80 y.o.   MRN: [id number] Chief Complaint  Patient presents with   Routine Post Op    [facility] 2 wk post carotid stent    Ethan Ayers is an 80 year old male that presents today post left carotid artery stent placement.  The patient notes that there is no pain or discomfort post stent placement.  He denies any visual issues or disturbances.  He denies any weakness of either extremity.  No TIA or CVA-like symptoms.  He notes that the wound is healed at this time as well.  Overall he is doing well.   Review of Systems  Eyes:  Negative for visual disturbance.  All other systems reviewed and are negative.     Objective:   Physical Exam Vitals reviewed.  HENT:     Head: Normocephalic.  Neck:     Vascular: No carotid bruit.  Cardiovascular:     Rate and Rhythm: Normal rate.     Pulses: Normal pulses.  Pulmonary:     Effort: Pulmonary effort is normal.  Skin:    General: Skin is warm and dry.  Neurological:     Mental Status: He is alert and oriented to person, place, and time.  Psychiatric:        Mood and Affect: Mood normal.        Behavior: Behavior normal.        Thought Content: Thought content normal.        Judgment: Judgment normal.    BP 122/71 (BP Location: Right Arm)   Pulse 92   Resp 16   Wt 156 lb 6.4 oz (70.9 kg)   BMI 21.81 kg/m   Past Medical History:  Diagnosis Date   A-fib (HCC)    BPH (benign prostatic hyperplasia)    Hypertension    TIA (transient ischemic attack)     Social History   Socioeconomic History   Marital status: Married    Spouse name: Not on file   Number of children: Not on file   Years of education: Not on file   Highest education level: Not on file  Occupational History   Not on file  Tobacco Use   Smoking status: Former    Types: Cigarettes    Quit date: 01/18/1976    Years since quitting: 44.8   Smokeless tobacco: Never  Substance and Sexual Activity    Alcohol use: No   Drug use: No   Sexual activity: Not on file  Other Topics Concern   Not on file  Social History Narrative   Not on file   Social Determinants of Health   Financial Resource Strain: Not on file  Food Insecurity: Not on file  Transportation Needs: Not on file  Physical Activity: Not on file  Stress: Not on file  Social Connections: Not on file  Intimate Partner Violence: Not on file    Past Surgical History:  Procedure Laterality Date   CAROTID PTA/STENT INTERVENTION Left 09/30/2020   Procedure: CAROTID PTA/STENT INTERVENTION;  Surgeon: Annice Needy, MD;  Location: [facility] INVASIVE CV LAB;  Service: Cardiovascular;  Laterality: Left;   TONSILLECTOMY      History reviewed. No pertinent family history.  No Known Allergies  CBC Latest Ref Rng & Units 10/01/2020 09/28/2020 09/23/2020  WBC 4.0 - 10.5 K/uL 9.6 10.6(H) 9.8  Hemoglobin 13.0 - 17.0 g/dL 8.2(N) 56.2 13.0  Hematocrit 39.0 - 52.0 % 29.0(L)  38.0(L) 38.8(L)  Platelets 150 - 400 K/uL 237 281 315      CMP     Component Value Date/Time   NA 138 10/01/2020 0239   K 3.4 (L) 10/01/2020 0239   CL 108 10/01/2020 0239   CO2 25 10/01/2020 0239   GLUCOSE 102 (H) 10/01/2020 0239   BUN 21 10/01/2020 0239   CREATININE 1.21 10/01/2020 0239   CALCIUM 8.3 (L) 10/01/2020 0239   PROT 6.0 (L) 09/24/2020 0451   ALBUMIN 3.2 (L) 09/24/2020 0451   AST 20 09/24/2020 0451   ALT 14 09/24/2020 0451   ALKPHOS 52 09/24/2020 0451   BILITOT 0.7 09/24/2020 0451   GFRNONAA 60 (L) 10/01/2020 0239     No results found.     Assessment & Plan:   1. Carotid stenosis, symptomatic, with infarction [facility]) Patient is doing well post left carotid stenting.  Patient is advised to continue with his dual antiplatelet therapy.  We will have the patient return in 1 month for noninvasive studies to evaluate carotid artery stenosis post stenting. - clopidogrel (PLAVIX) 75 MG tablet; Take 1 tablet (75 mg total) by mouth daily.  Dispense:  30 tablet; Refill: 11 - VAS US CAROTID; Future   Current Outpatient Medications on File Prior to Visit  Medication Sig Dispense Refill   aspirin EC 81 MG EC tablet Take 1 tablet (81 mg total) by mouth daily at 6 (six) AM. Swallow whole. 30 tablet 11   atorvastatin (LIPITOR) 40 MG tablet Take 1 tablet (40 mg total) by mouth at bedtime. (Patient taking differently: Take 10 mg by mouth at bedtime.) 30 tablet 0   diltiazem (CARDIZEM CD) 120 MG 24 hr capsule Take 120 mg by mouth daily.     Iron-Vitamin C (VITRON-C PO) Take by mouth daily.     tamsulosin (FLOMAX) 0.4 MG CAPS capsule Take 0.4 mg by mouth daily.     No current facility-administered medications on file prior to visit.    There are no Patient Instructions on file for this visit. Return in about 1 month (around 11/15/2020) for Carotid stenosis; JD /FB.   Georgiana Spinner, NP

## 2020-11-17 ENCOUNTER — Ambulatory Visit (INDEPENDENT_AMBULATORY_CARE_PROVIDER_SITE_OTHER): Payer: Medicare Other

## 2020-11-17 ENCOUNTER — Ambulatory Visit (INDEPENDENT_AMBULATORY_CARE_PROVIDER_SITE_OTHER): Payer: Medicare Other | Admitting: Vascular Surgery

## 2020-11-17 ENCOUNTER — Encounter (INDEPENDENT_AMBULATORY_CARE_PROVIDER_SITE_OTHER): Payer: Self-pay | Admitting: Vascular Surgery

## 2020-11-17 ENCOUNTER — Other Ambulatory Visit: Payer: Self-pay

## 2020-11-17 VITALS — BP 128/82 | HR 92 | Resp 16 | Wt 159.8 lb

## 2020-11-17 DIAGNOSIS — I63239 Cerebral infarction due to unspecified occlusion or stenosis of unspecified carotid arteries: Secondary | ICD-10-CM

## 2020-11-17 DIAGNOSIS — R040 Epistaxis: Secondary | ICD-10-CM

## 2020-11-17 DIAGNOSIS — I10 Essential (primary) hypertension: Secondary | ICD-10-CM | POA: Insufficient documentation

## 2020-11-17 NOTE — Progress Notes (Signed)
Patient ID: Ethan Ayers, male   DOB: 08-05-1940, 80 y.o.   MRN: [id number]  Chief Complaint  Patient presents with   Follow-up    Ultrasound follow up    HPI Ethan Ayers is a 80 y.o. male.  Patient returns in follow-up.  He is about a month status post left carotid artery stent placement for moderate stenosis with recent stroke.  At that time, he was also having intractable nosebleeds and we performed embolization of a maxillary branch of the right external carotid artery.  Since the procedure, he has had no further nosebleeds.  He has followed up with ENT.  He has been recovering from his stroke and is now doing physical therapy.  He is doing quite well today.  He did have a fall immediately after the procedure and had a large hematoma and a goose egg on his right temple.  There is still a knot on the right temple although it is much smaller.  Patient denies any new focal neurologic symptoms.  His access site is well-healed.  His duplex today shows a widely patent left carotid artery stent without recurrent stenosis and 1 to 39% right ICA stenosis.   Past Medical History:  Diagnosis Date   A-fib [facility])    BPH (benign prostatic hyperplasia)    Hypertension    TIA (transient ischemic attack)     Past Surgical History:  Procedure Laterality Date   CAROTID PTA/STENT INTERVENTION Left 09/30/2020   Procedure: CAROTID PTA/STENT INTERVENTION;  Surgeon: Annice Needy, MD;  Location: [facility] INVASIVE CV LAB;  Service: Cardiovascular;  Laterality: Left;   TONSILLECTOMY        No Known Allergies  Current Outpatient Medications  Medication Sig Dispense Refill   atorvastatin (LIPITOR) 40 MG tablet Take 1 tablet (40 mg total) by mouth at bedtime. (Patient taking differently: Take 10 mg by mouth at bedtime.) 30 tablet 0   clopidogrel (PLAVIX) 75 MG tablet Take 1 tablet (75 mg total) by mouth daily. 30 tablet 11   diltiazem (CARDIZEM CD) 120 MG 24 hr capsule Take 120 mg by mouth daily.      ELIQUIS 2.5 MG TABS tablet SMARTSIG:1 Tablet(s) By Mouth Every 12 Hours     Iron-Vitamin C (VITRON-C PO) Take by mouth daily.     tamsulosin (FLOMAX) 0.4 MG CAPS capsule Take 0.4 mg by mouth daily.     aspirin EC 81 MG EC tablet Take 1 tablet (81 mg total) by mouth daily at 6 (six) AM. Swallow whole. (Patient not taking: Reported on 11/17/2020) 30 tablet 11   No current facility-administered medications for this visit.        Physical Exam BP 128/82 (BP Location: Left Arm)   Pulse 92   Resp 16   Wt 159 lb 12.8 oz (72.5 kg)   BMI 22.29 kg/m  Gen:  WD/WN, NAD Skin: incision C/D/I     Assessment/Plan:  Carotid stenosis, symptomatic, with infarction (HCC) His duplex today shows a widely patent left carotid artery stent without recurrent stenosis and 1 to 39% right ICA stenosis.  Doing well.  Continue current medical regimen.  Recheck in 3 months.  Frequent nosebleeds The patient was having essentially intractable nosebleeds on the right side.  Since his embolization procedure about a month ago this apparently has resolved and he has had not had any further nosebleeds.  Has also seen ENT.  Essential hypertension blood pressure control important in reducing the progression of atherosclerotic disease. On  appropriate oral medications.      Festus Barren 11/17/2020, 12:25 PM   This note was created with Dragon medical transcription system.  Any errors from dictation are unintentional.

## 2020-11-17 NOTE — Assessment & Plan Note (Signed)
blood pressure control important in reducing the progression of atherosclerotic disease. On appropriate oral medications.  

## 2020-11-17 NOTE — Assessment & Plan Note (Signed)
The patient was having essentially intractable nosebleeds on the right side.  Since his embolization procedure about a month ago this apparently has resolved and he has had not had any further nosebleeds.  Has also seen ENT.

## 2020-11-17 NOTE — Assessment & Plan Note (Signed)
His duplex today shows a widely patent left carotid artery stent without recurrent stenosis and 1 to 39% right ICA stenosis.  Doing well.  Continue current medical regimen.  Recheck in 3 months.

## 2020-11-20 ENCOUNTER — Emergency Department: Payer: Medicare Other

## 2020-11-20 ENCOUNTER — Observation Stay
Admission: EM | Admit: 2020-11-20 | Discharge: 2020-11-21 | Disposition: A | Discharge status: Home / Self Care | Payer: Medicare Other | Attending: Internal Medicine | Admitting: Internal Medicine

## 2020-11-20 ENCOUNTER — Observation Stay: Payer: Medicare Other

## 2020-11-20 ENCOUNTER — Other Ambulatory Visit: Payer: Self-pay

## 2020-11-20 DIAGNOSIS — R2681 Unsteadiness on feet: Secondary | ICD-10-CM | POA: Diagnosis not present

## 2020-11-20 DIAGNOSIS — Z79899 Other long term (current) drug therapy: Secondary | ICD-10-CM | POA: Diagnosis not present

## 2020-11-20 DIAGNOSIS — R4182 Altered mental status, unspecified: Secondary | ICD-10-CM | POA: Insufficient documentation

## 2020-11-20 DIAGNOSIS — R471 Dysarthria and anarthria: Secondary | ICD-10-CM | POA: Diagnosis not present

## 2020-11-20 DIAGNOSIS — G459 Transient cerebral ischemic attack, unspecified: Principal | ICD-10-CM | POA: Diagnosis present

## 2020-11-20 DIAGNOSIS — Z7902 Long term (current) use of antithrombotics/antiplatelets: Secondary | ICD-10-CM | POA: Insufficient documentation

## 2020-11-20 DIAGNOSIS — R531 Weakness: Secondary | ICD-10-CM | POA: Diagnosis not present

## 2020-11-20 DIAGNOSIS — I482 Chronic atrial fibrillation, unspecified: Secondary | ICD-10-CM

## 2020-11-20 DIAGNOSIS — Z20822 Contact with and (suspected) exposure to covid-19: Secondary | ICD-10-CM | POA: Insufficient documentation

## 2020-11-20 DIAGNOSIS — Z87891 Personal history of nicotine dependence: Secondary | ICD-10-CM | POA: Insufficient documentation

## 2020-11-20 DIAGNOSIS — I10 Essential (primary) hypertension: Secondary | ICD-10-CM | POA: Diagnosis not present

## 2020-11-20 DIAGNOSIS — Z7901 Long term (current) use of anticoagulants: Secondary | ICD-10-CM | POA: Diagnosis not present

## 2020-11-20 DIAGNOSIS — Y900 Blood alcohol level of less than 20 mg/100 ml: Secondary | ICD-10-CM | POA: Diagnosis not present

## 2020-11-20 DIAGNOSIS — E785 Hyperlipidemia, unspecified: Secondary | ICD-10-CM | POA: Diagnosis not present

## 2020-11-20 DIAGNOSIS — Z8673 Personal history of transient ischemic attack (TIA), and cerebral infarction without residual deficits: Secondary | ICD-10-CM | POA: Insufficient documentation

## 2020-11-20 DIAGNOSIS — I4891 Unspecified atrial fibrillation: Secondary | ICD-10-CM | POA: Diagnosis not present

## 2020-11-20 DIAGNOSIS — R519 Headache, unspecified: Secondary | ICD-10-CM | POA: Diagnosis present

## 2020-11-20 DIAGNOSIS — G9341 Metabolic encephalopathy: Secondary | ICD-10-CM

## 2020-11-20 LAB — PROTIME-INR
INR: 1.1 (ref 0.8–1.2)
Prothrombin Time: 14.2 seconds (ref 11.4–15.2)

## 2020-11-20 LAB — CBC
HCT: 38.8 % — ABNORMAL LOW (ref 39.0–52.0)
Hemoglobin: 12.8 g/dL — ABNORMAL LOW (ref 13.0–17.0)
MCH: 30.5 pg (ref 26.0–34.0)
MCHC: 33 g/dL (ref 30.0–36.0)
MCV: 92.4 fL (ref 80.0–100.0)
Platelets: 264 10*3/uL (ref 150–400)
RBC: 4.2 MIL/uL — ABNORMAL LOW (ref 4.22–5.81)
RDW: 16.6 % — ABNORMAL HIGH (ref 11.5–15.5)
WBC: 9 10*3/uL (ref 4.0–10.5)
nRBC: 0 % (ref 0.0–0.2)

## 2020-11-20 LAB — COMPREHENSIVE METABOLIC PANEL
ALT: 14 U/L (ref 0–44)
AST: 19 U/L (ref 15–41)
Albumin: 4.4 g/dL (ref 3.5–5.0)
Alkaline Phosphatase: 94 U/L (ref 38–126)
Anion gap: 12 (ref 5–15)
BUN: 20 mg/dL (ref 8–23)
CO2: 25 mmol/L (ref 22–32)
Calcium: 9.3 mg/dL (ref 8.9–10.3)
Chloride: 101 mmol/L (ref 98–111)
Creatinine, Ser: 1.24 mg/dL (ref 0.61–1.24)
GFR, Estimated: 58 mL/min — ABNORMAL LOW (ref >60)
Glucose, Bld: 115 mg/dL — ABNORMAL HIGH (ref 70–99)
Potassium: 3.4 mmol/L — ABNORMAL LOW (ref 3.5–5.1)
Sodium: 138 mmol/L (ref 135–145)
Total Bilirubin: 0.9 mg/dL (ref 0.3–1.2)
Total Protein: 7.9 g/dL (ref 6.5–8.1)

## 2020-11-20 LAB — DIFFERENTIAL
Abs Immature Granulocytes: 0.05 10*3/uL (ref 0.00–0.07)
Basophils Absolute: 0.1 10*3/uL (ref 0.0–0.1)
Basophils Relative: 1 %
Eosinophils Absolute: 0.4 10*3/uL (ref 0.0–0.5)
Eosinophils Relative: 4 %
Immature Granulocytes: 1 %
Lymphocytes Relative: 15 %
Lymphs Abs: 1.3 10*3/uL (ref 0.7–4.0)
Monocytes Absolute: 0.7 10*3/uL (ref 0.1–1.0)
Monocytes Relative: 8 %
Neutro Abs: 6.4 10*3/uL (ref 1.7–7.7)
Neutrophils Relative %: 71 %

## 2020-11-20 LAB — LIPID PANEL
Cholesterol: 148 mg/dL (ref 0–200)
HDL: 62 mg/dL (ref >40)
LDL Cholesterol: 76 mg/dL (ref 0–99)
Total CHOL/HDL Ratio: 2.4 RATIO
Triglycerides: 50 mg/dL (ref <150)
VLDL: 10 mg/dL (ref 0–40)

## 2020-11-20 LAB — URINALYSIS, ROUTINE W REFLEX MICROSCOPIC
Bilirubin Urine: NEGATIVE
Glucose, UA: NEGATIVE mg/dL
Hgb urine dipstick: NEGATIVE
Ketones, ur: NEGATIVE mg/dL
Leukocytes,Ua: NEGATIVE
Nitrite: NEGATIVE
Protein, ur: NEGATIVE mg/dL
Specific Gravity, Urine: 1.008 (ref 1.005–1.030)
pH: 7 (ref 5.0–8.0)

## 2020-11-20 LAB — CBG MONITORING, ED: Glucose-Capillary: 117 mg/dL — ABNORMAL HIGH (ref 70–99)

## 2020-11-20 LAB — URINE DRUG SCREEN, QUALITATIVE (ARMC ONLY)
Amphetamines, Ur Screen: NOT DETECTED
Barbiturates, Ur Screen: NOT DETECTED
Benzodiazepine, Ur Scrn: NOT DETECTED
Cannabinoid 50 Ng, Ur ~~LOC~~: NOT DETECTED
Cocaine Metabolite,Ur ~~LOC~~: NOT DETECTED
MDMA (Ecstasy)Ur Screen: NOT DETECTED
Methadone Scn, Ur: NOT DETECTED
Opiate, Ur Screen: NOT DETECTED
Phencyclidine (PCP) Ur S: NOT DETECTED
Tricyclic, Ur Screen: NOT DETECTED

## 2020-11-20 LAB — ETHANOL: Alcohol, Ethyl (B): <10 mg/dL (ref <10)

## 2020-11-20 LAB — RESP PANEL BY RT-PCR (FLU A&B, COVID) ARPGX2
Influenza A by PCR: NEGATIVE
Influenza B by PCR: NEGATIVE
SARS Coronavirus 2 by RT PCR: NEGATIVE

## 2020-11-20 LAB — APTT: aPTT: 28 seconds (ref 24–36)

## 2020-11-20 LAB — VITAMIN B12: Vitamin B-12: 350 pg/mL (ref 180–914)

## 2020-11-20 LAB — HEMOGLOBIN A1C
Hgb A1c MFr Bld: 5.4 % (ref 4.8–5.6)
Mean Plasma Glucose: 108.28 mg/dL

## 2020-11-20 MED ORDER — HALOPERIDOL LACTATE 5 MG/ML IJ SOLN
2.0000 mg | Freq: Four times a day (QID) | INTRAMUSCULAR | Status: DC | PRN
  Administered 2020-11-20: 2 mg via INTRAVENOUS
  Filled 2020-11-20: qty 1

## 2020-11-20 MED ORDER — FE FUMARATE-B12-VIT C-FA-IFC PO CAPS
ORAL_CAPSULE | Freq: Every day | ORAL | Status: DC
  Administered 2020-11-20 – 2020-11-21 (×2): 1 via ORAL
  Filled 2020-11-20 (×2): qty 1

## 2020-11-20 MED ORDER — DILTIAZEM HCL ER COATED BEADS 120 MG PO CP24
120.0000 mg | ORAL_CAPSULE | Freq: Every day | ORAL | Status: DC
  Administered 2020-11-20 – 2020-11-21 (×2): 120 mg via ORAL
  Filled 2020-11-20 (×2): qty 1

## 2020-11-20 MED ORDER — LORAZEPAM 2 MG/ML IJ SOLN
1.0000 mg | Freq: Once | INTRAMUSCULAR | Status: AC
  Administered 2020-11-20: 1 mg via INTRAVENOUS
  Filled 2020-11-20: qty 1

## 2020-11-20 MED ORDER — ACETAMINOPHEN 325 MG RE SUPP: 650.0000 mg | RECTAL | Status: DC | PRN

## 2020-11-20 MED ORDER — ATORVASTATIN CALCIUM 20 MG PO TABS
10.0000 mg | ORAL_TABLET | Freq: Every day | ORAL | Status: DC
  Administered 2020-11-20: 10 mg via ORAL
  Filled 2020-11-20: qty 1

## 2020-11-20 MED ORDER — SENNOSIDES-DOCUSATE SODIUM 8.6-50 MG PO TABS: 1.0000 | ORAL_TABLET | Freq: Every evening | ORAL | Status: DC | PRN

## 2020-11-20 MED ORDER — IOHEXOL 350 MG/ML SOLN
75.0000 mL | Freq: Once | INTRAVENOUS | Status: AC | PRN
  Administered 2020-11-20: 75 mL via INTRAVENOUS

## 2020-11-20 MED ORDER — ACETAMINOPHEN 325 MG PO TABS: 650.0000 mg | ORAL_TABLET | ORAL | Status: DC | PRN

## 2020-11-20 MED ORDER — QUETIAPINE FUMARATE 25 MG PO TABS
25.0000 mg | ORAL_TABLET | Freq: Every day | ORAL | Status: DC
  Administered 2020-11-20: 25 mg via ORAL
  Filled 2020-11-20: qty 1

## 2020-11-20 MED ORDER — SODIUM CHLORIDE 0.9 % IV SOLN: INTRAVENOUS | Status: DC

## 2020-11-20 MED ORDER — ACETAMINOPHEN 160 MG/5ML PO SOLN
650.0000 mg | ORAL | Status: DC | PRN
  Filled 2020-11-20: qty 20.3

## 2020-11-20 MED ORDER — STROKE: EARLY STAGES OF RECOVERY BOOK: Freq: Once | Status: DC

## 2020-11-20 MED ORDER — ONDANSETRON HCL 4 MG/2ML IJ SOLN
4.0000 mg | INTRAMUSCULAR | Status: DC | PRN
  Administered 2020-11-20: 4 mg via INTRAVENOUS
  Filled 2020-11-20: qty 2

## 2020-11-20 MED ORDER — APIXABAN 2.5 MG PO TABS
2.5000 mg | ORAL_TABLET | Freq: Two times a day (BID) | ORAL | Status: DC
  Administered 2020-11-20 – 2020-11-21 (×3): 2.5 mg via ORAL
  Filled 2020-11-20 (×4): qty 1

## 2020-11-20 MED ORDER — TAMSULOSIN HCL 0.4 MG PO CAPS
0.4000 mg | ORAL_CAPSULE | Freq: Every day | ORAL | Status: DC
  Administered 2020-11-21: 0.4 mg via ORAL
  Filled 2020-11-20 (×2): qty 1

## 2020-11-20 MED ORDER — TRAZODONE HCL 50 MG PO TABS: 25.0000 mg | ORAL_TABLET | Freq: Every evening | ORAL | Status: DC | PRN

## 2020-11-20 MED ORDER — CLOPIDOGREL BISULFATE 75 MG PO TABS
75.0000 mg | ORAL_TABLET | Freq: Every day | ORAL | Status: DC
  Administered 2020-11-20 – 2020-11-21 (×2): 75 mg via ORAL
  Filled 2020-11-20 (×2): qty 1

## 2020-11-20 NOTE — Procedures (Signed)
ROUTINE EEG REPORT DATE OF STUDY:  11/20/2020 from 15:55 to 16:40  HISTORY: 80yo man with slurred speech and left sided weakness, evaluate for seizure as etiology.  DESCRIPTION: During maximal wakefulness, the background was organized and continuous but mildly slow.  There was a posterior dominant alpha rhythm at 6-7 Hz which was not well seen over the right hemisphere. Spontaneous variability and reactivity to stimulation were present.  No sleep structures were seen. There was a relative loss of faster frequencies over the right hemisphere. There was continuous polymorphic delta slowing over the right frontotemporal region between 1-2 Hz.  Occasionally this slowing appeared to be rhythmic at 1 Hz with no evolution for 10 seconds maximum. There were frequent bursts of generalized polymorphic delta slowing at 1-2 Hz.  These were rhythmic (GRDA) at 1 Hz at times for less than 10 seconds. No definite independent epileptiform discharges were recorded. No clinical or electrographic seizures were recorded.   Hyperventilation and photic stimulation were not performed.  CLASSIFICATION (EEG IMPRESSION): Abnormal Significance III (awake- lethargic) Continuous slow, regional, right frontotemporal Intermittent rhythmic slow, regional, right frontotemporal (LRDA) Intermittent rhythmic slow, generalized (GRDA) Intermittent slow, generalized Background slow  CLINICAL INTERPRETATION: 1) There is evidence of severe focal cerebral dysfunction located over the right frontotemporal region, and the rhythmic slowing seen there suggests a potential epileptogenic foci in this region.  This is of unclear etiology. 2) There is evidence of moderate to severe diffuse encephalopathy that is non-specific and could be due to postictal state or toxic metabolic derangements. 3) No seizures were seen on this recording, however this routine EEG cannot exclude subclinical seizures and long term EEG monitoring is  recommended.  Darly Massi A. Senaida Ores, MD Neurology and Clinical Neurophysiology

## 2020-11-20 NOTE — Evaluation (Signed)
Speech Language Pathology Evaluation Patient Details Name: Ethan Ayers MRN: [id number] DOB: 01/14/1941 Today's Date: 11/20/2020 Time: 1884-1660 SLP Time Calculation (min) (ACUTE ONLY): 13 min  Problem List:  Patient Active Problem List   Diagnosis Date Noted   TIA (transient ischemic attack) 11/20/2020   Frequent nosebleeds 11/17/2020   Essential hypertension 11/17/2020   Carotid stenosis, symptomatic, with infarction (HCC) 09/30/2020   Carotid stenosis 09/30/2020   Altered mental status 09/22/2020   Past Medical History:  Past Medical History:  Diagnosis Date   A-fib (HCC)    BPH (benign prostatic hyperplasia)    Hypertension    TIA (transient ischemic attack)    Past Surgical History:  Past Surgical History:  Procedure Laterality Date   CAROTID PTA/STENT INTERVENTION Left 09/30/2020   Procedure: CAROTID PTA/STENT INTERVENTION;  Surgeon: Annice Needy, MD;  Location: [facility] INVASIVE CV LAB;  Service: Cardiovascular;  Laterality: Left;   TONSILLECTOMY     HPI:  Ethan Ayers is a 80 y.o. Caucasian male with medical history significant for carotid stenosis status post left carotid artery stent on 09/22/20 at which time he had a stroke with residual slurred speech, atrial fibrillation on Eliquis, hypertension and BPH. Pt presented to the ER 11/20/20 with acute onset of ataxia as well as slurred speech with associated left-sided weakness and altered mental status with confusion that started around 1 AM.  He admitted to headache without dizziness or blurred vision or diplopia. Pt with agitiation/combative overnight and received medications   Assessment / Plan / Recommendation Clinical Impression  Pt presents as severely confused and restless. As such, he is oriented to himself and his wife, he is unable to focus attention to tasks and he is unable to follow directions. When pt said his name and his wife's name, speech intelligibility was good. However, verbal output was minimal. At this  time, there isn't a clear etiology for pt's symptoms therefore it is difficult to diagnosis cognition vs language impairment. However, pt's confusion is a significant change from his baseline, therefore recommend skilled ST intervention.    SLP Assessment  SLP Recommendation/Assessment: Patient needs continued Speech Lanaguage Pathology Services SLP Visit Diagnosis: Cognitive communication deficit (R41.841)    Recommendations for follow up therapy are one component of a multi-disciplinary discharge planning process, led by the attending physician.  Recommendations may be updated based on patient status, additional functional criteria and insurance authorization.    Follow Up Recommendations  Inpatient Rehab    Frequency and Duration min 2x/week  2 weeks      SLP Evaluation Cognition  Overall Cognitive Status: Impaired/Different from baseline Arousal/Alertness: Awake/alert Orientation Level: Oriented to person;Disoriented to place;Disoriented to time;Disoriented to situation Attention: Focused Focused Attention: Impaired Focused Attention Impairment: Verbal basic;Functional basic Memory: Impaired Executive Function:  (all impaired by lower level deficits) Behaviors: Restless Safety/Judgment: Impaired       Comprehension  Auditory Comprehension Overall Auditory Comprehension: Impaired (appears related to altered mentation) Reading Comprehension Reading Status: Not tested    Expression Expression Primary Mode of Expression: Verbal Verbal Expression Overall Verbal Expression: Impaired (appears related to altered mentation) Written Expression Dominant Hand: Right   Oral / Motor  Oral Motor/Sensory Function Overall Oral Motor/Sensory Function: Within functional limits Motor Speech Overall Motor Speech: Appears within functional limits for tasks assessed   GO                   Ethan Ayers B. Dreama Saa M.S., CCC-SLP, [facility] Speech-Language Pathologist Rehabilitation Services Office  [phone]  Ethan Ayers 11/20/2020, 3:45 PM

## 2020-11-20 NOTE — ED Notes (Signed)
Pt repositioned in bed. Pt sitting up eating lunch with wife assisting. Pt got OOB to urinate in urinal with wife and this nurse assisting. Pt restless and confused. Bed alarm working and new promofit placed.

## 2020-11-20 NOTE — Evaluation (Signed)
Occupational Therapy Evaluation Patient Details Name: Ethan Ayers MRN: [id number] DOB: 07-Apr-1940 Today's Date: 11/20/2020   History of Present Illness Ethan Ayers is a 80 y.o. Caucasian male with medical history significant for carotid stenosis status post left carotid artery stent on 09/22/20 at which time he had a stroke with residual slurred speech, atrial fibrillation on Eliquis, hypertension and BPH. Pt presented to the ER 11/20/20 with acute onset of ataxia as well as slurred speech with associated left-sided weakness and altered mental status with confusion that started around 1 AM.  He admitted to headache without dizziness or blurred vision or diplopia. Pt with agitiation/combative overnight and received medications   Clinical Impression   Ethan Ayers was seen for OT evaluation this date. Prior to hospital admission, pt was Independent for mobility and ADLs including driving. Pt lives with spouse (available 24/7) in 2 level home c 3 STE, 13 steps to second floor. Pt presents to acute OT demonstrating impaired ADL performance and functional mobility 2/2 decreased activity tolerance, functional strength/balance deficits, poor insight into deficits, and decreased safety awareness. Pt currently requires MOD A for periaccess at bed level - pt assists with bridging at bed level.  MAX A exit L side of bed, initially CGA + BUE support static sitting decreasing to MIN A as pt fatigued and demonstrated increased confusion. MAX A for sit<>stand at EOB from elevated stretcher - BLE locked against bed and increased confusion noted in standing. Wife at bedside provides PLOF and assist with return to bed MAX A x2. Pt would benefit from skilled OT to address noted impairments and functional limitations (see below for any additional details) in order to maximize safety and independence while minimizing falls risk and caregiver burden. Upon hospital discharge, recommend AIR to maximize pt safety and return to  PLOF.       Recommendations for follow up therapy are one component of a multi-disciplinary discharge planning process, led by the attending physician.  Recommendations may be updated based on patient status, additional functional criteria and insurance authorization.   Follow Up Recommendations  Acute inpatient rehab (3hours/day)    Assistance Recommended at Discharge Frequent or constant Supervision/Assistance  Functional Status Assessment  Patient has had a recent decline in their functional status and demonstrates the ability to make significant improvements in function in a reasonable and predictable amount of time.  Equipment Recommendations  Other (comment) (defer to next venue of care)    Recommendations for Other Services       Precautions / Restrictions Precautions Precautions: Fall Restrictions Weight Bearing Restrictions: No      Mobility Bed Mobility Overal bed mobility: Needs Assistance Bed Mobility: Supine to Sit;Sit to Supine Rolling: Mod assist   Supine to sit: Max assist Sit to supine: Max assist;+2 for physical assistance   General bed mobility comments: increased assist to return to bed 2/2 confusion - wife assisting    Transfers Overall transfer level: Needs assistance Equipment used: 1 person hand held assist Transfers: Sit to/from Stand Sit to Stand: Max assist;From elevated surface           General transfer comment: BLE braced on bed to achieve near upright posture      Balance Overall balance assessment: Needs assistance;History of Falls Sitting-balance support: Bilateral upper extremity supported;Feet supported Sitting balance-Leahy Scale: Poor Sitting balance - Comments: Initially CGA decreaseing to MIN-MOD A following mobility as pt demonstrates decreased cognition   Standing balance support: Bilateral upper extremity supported;During functional activity;Reliant on assistive  device for balance Standing balance-Leahy Scale:  Poor Standing balance comment: Poor static standing balance; Unable to take steps                           ADL either performed or assessed with clinical judgement   ADL Overall ADL's : Needs assistance/impaired                                       General ADL Comments: MOD A for periaccess at bed level - pt assists with bridging at bed level. MAX A for ADL t/f - BLE locked against bed and increased confusion in standing. MIN A for static sitting balance on elevated stretcher      Pertinent Vitals/Pain Pain Assessment: No/denies pain     Hand Dominance Right   Extremity/Trunk Assessment Upper Extremity Assessment Upper Extremity Assessment: Generalized weakness (3+/5 grossly)   Lower Extremity Assessment Lower Extremity Assessment: Generalized weakness       Communication Communication Communication: No difficulties   Cognition Arousal/Alertness: Awake/alert;Lethargic Behavior During Therapy: Restless Overall Cognitive Status: Impaired/Different from baseline Area of Impairment: Orientation;Attention;Memory;Following commands;Safety/judgement;Problem solving                 Orientation Level: Disoriented to;Time Current Attention Level: Sustained Memory: Decreased recall of precautions;Decreased short-term memory Following Commands: Follows one step commands inconsistently Safety/Judgement: Decreased awareness of safety;Decreased awareness of deficits   Problem Solving: Slow processing;Decreased initiation;Difficulty sequencing;Requires verbal cues;Requires tactile cues General Comments: Pt states location as hospital, unable to state date (however wife reports pt typically not aware of date and always states 2020 as year). Initially alert and following commands with cues, following mobility pt demosntrated decreased command following despite MAX cues. Immediately asleep upon return to supine     General Comments       Exercises  Exercises: Other exercises Other Exercises Other Exercises: Pt and spouse educated re: OT role, DME recs, d/c recs, falls prevention, stroke education, HEP Other Exercises: LBD, toileting, sup<>sit, sit<>stand x2, sitting/standing balance/tolerance   Shoulder Instructions      Home Living Family/patient expects to be discharged to:: Private residence Living Arrangements: Spouse/significant other Available Help at Discharge: Family;Available 24 hours/day Type of Home: House Home Access: Stairs to enter [employer] of Steps: 3 Entrance Stairs-Rails: Left Home Layout: Two level;Bed/bath upstairs Alternate Level Stairs-Number of Steps: 13 Alternate Level Stairs-Rails: Left Bathroom Shower/Tub: Producer, television/film/video: Standard     Home Equipment: Agricultural consultant (2 wheels);Cane - single point;Shower seat   Additional Comments: History obtained from spouse due to impaired cognition of patient + decreased arousal      Prior Functioning/Environment Prior Level of Function : Independent/Modified Independent;Driving             Mobility Comments: Spouse reports that pt had been going to PT to improve balance and walking. She reports that in the past 2 weeks the patient has been able to ambulate without a device ADLs Comments: Independent with ADLs; Assistance from wife for IADLs        OT Problem List: Decreased strength;Decreased range of motion;Decreased activity tolerance;Impaired balance (sitting and/or standing);Decreased safety awareness;Decreased cognition      OT Treatment/Interventions: Self-care/ADL training;Therapeutic exercise;Energy conservation;DME and/or AE instruction;Neuromuscular education;Therapeutic activities;Cognitive remediation/compensation;Patient/family education;Balance training    OT Goals(Current goals can be found in the care plan section) Acute Rehab OT  Goals Patient Stated Goal: to feel better OT Goal Formulation: With  patient/family Time For Goal Achievement: 12/04/20 Potential to Achieve Goals: Good ADL Goals Pt Will Perform Grooming: sitting;with modified independence Pt Will Perform Lower Body Dressing: with min assist;sitting/lateral leans Pt Will Transfer to Toilet: with mod assist;stand pivot transfer;bedside commode (c LRAD PRN)  OT Frequency: Min 5X/week   Barriers to D/C:            Co-evaluation              AM-PAC OT "6 Clicks" Daily Activity     Outcome Measure Help from another person eating meals?: A Little Help from another person taking care of personal grooming?: A Lot Help from another person toileting, which includes using toliet, bedpan, or urinal?: A Lot Help from another person bathing (including washing, rinsing, drying)?: A Lot Help from another person to put on and taking off regular upper body clothing?: A Little Help from another person to put on and taking off regular lower body clothing?: A Lot 6 Click Score: 14   End of Session    Activity Tolerance: Patient tolerated treatment well;Patient limited by fatigue Patient left: in bed;with call bell/phone within reach;with bed alarm set;with family/visitor present  OT Visit Diagnosis: Other abnormalities of gait and mobility (R26.89);Muscle weakness (generalized) (M62.81)                Time: 2423-5361 OT Time Calculation (min): 21 min Charges:  OT General Charges $OT Visit: 1 Visit OT Evaluation $OT Eval Moderate Complexity: 1 Mod OT Treatments $Self Care/Home Management : 8-22 mins  Kathie Dike, M.S. OTR/L  11/20/20, 2:52 PM  ascom [phone]

## 2020-11-20 NOTE — ED Notes (Signed)
Pt assisted with the urinal at this time and repositioned back into bed comfortably.

## 2020-11-20 NOTE — Consult Note (Signed)
[facility] TeleNeurology Consult Services   Patient Name:   Ethan Ayers, Ethan Ayers Date of Birth:   19-Jul-1940 Identification Number:   MRN - [id number] Date of Service:   11/20/2020 02:57:33  Diagnosis:       R53.1 - Weakness       R47.81 - Slurred speech  Impression:      80 M, h/o HL, afib on eliquis, who awoke from sleep with slurred speech and L sided wkness that appear to be improving. NIHSS 1 for mild dysarthria on my eval. Not a thrombolytic candidate due to being on eliquis. Head CT neg for acute abnl. Will adm for stroke workup. Defer on angio/perfusion as sx not consistent with LVO.    PLAN  - MRI brain and MRA head w/o contrast  - MRA neck w/ and w/o contrast  - TTE w/bubble  - check a1c and LDL  - continue eliquis for now  - neuro to follow  --  Metrics: Last Known Well: 11/19/2020 23:00:00 [facility] Notification Time: 11/20/2020 02:57:33 Arrival Time: 11/20/2020 02:04:00 Stamp Time: 11/20/2020 02:57:33 Initial Response Time: 11/20/2020 03:02:39 Symptoms: L sided wkness, slurred speech. NIHSS Start Assessment Time: 11/20/2020 03:11:53 Patient is not a candidate for Thrombolytic. Thrombolytic Medical Decision: 11/20/2020 03:10:00 Patient was not deemed candidate for Thrombolytic because of following reasons: Use of NOAs within 48 hours.  CT head showed no acute hemorrhage or acute core infarct.  ED Physician notified of diagnostic impression and management plan on 11/20/2020 03:20:36  Advanced Imaging: Advanced Imaging Not Completed because:  not suggestive of LVO   Our recommendations are outlined below.  Recommendations:        Stroke/Telemetry Floor       Neuro Checks       Bedside Swallow Eval       DVT Prophylaxis       IV Fluids, Normal Saline       Head of Bed 30 Degrees       Euglycemia and Avoid Hyperthermia (PRN Acetaminophen)   Sign Out:       Discussed with Emergency Department  Provider    ------------------------------------------------------------------------------  History of Present Illness: Patient is a 80 year old Male.  Patient was brought by EMS for symptoms of L sided wkness, slurred speech.  80 M, h/o afib on eliquis, hyperlipidemia, who was LKW at 59 PM when he went to bed. He awoke at 1 AM to use the bathroom and his wife noted that he was slurring his words and that his L side was weak. Pt came to ER for eval. On arrival ER doctor noted some drift of his L arm and leg. On my eval after CT scan, NIHSS for mild dysarthria. I didn't discern any obvious drift of the L.   Anticoagulant use:  Yes eliquis  Antiplatelet use: Yes asa  Allergies:  Reviewed   Examination: 1A: Level of Consciousness - Alert; keenly responsive + 0 1B: Ask Month and Age - Both Questions Right + 0 1C: Blink Eyes & Squeeze Hands - Performs Both Tasks + 0 2: Test Horizontal Extraocular Movements - Normal + 0 3: Test Visual Fields - No Visual Loss + 0 4: Test Facial Palsy (Use Grimace if Obtunded) - Normal symmetry + 0 5A: Test Left Arm Motor Drift - No Drift for 10 Seconds + 0 5B: Test Right Arm Motor Drift - No Drift for 10 Seconds + 0 6A: Test Left Leg Motor Drift - No Drift for 5 Seconds + 0 6B:  Test Right Leg Motor Drift - No Drift for 5 Seconds + 0 7: Test Limb Ataxia (FNF/Heel-Shin) - No Ataxia + 0 8: Test Sensation - Normal; No sensory loss + 0 9: Test Language/Aphasia - Normal; No aphasia + 0 10: Test Dysarthria - Mild-Moderate Dysarthria: Slurring but can be understood + 1 11: Test Extinction/Inattention - No abnormality + 0  NIHSS Score: 1   Pre-Morbid Modified Rankin Scale: 0 Points = No symptoms at all   Patient/Family was informed the Neurology Consult would occur via TeleHealth consult by way of interactive audio and video telecommunications and consented to receiving care in this manner.   Patient is being evaluated for possible acute neurologic  impairment and high probability of imminent or life-threatening deterioration. I spent total of 17 minutes providing care to this patient, including time for face to face visit via telemedicine, review of medical records, imaging studies and discussion of findings with providers, the patient and/or family.   Dr Othelia Pulling   [facility] [phone]  Case [id number]

## 2020-11-20 NOTE — ED Notes (Signed)
MRI called, they will try again to come for pt and do MRI in about 30 min.

## 2020-11-20 NOTE — Evaluation (Addendum)
Physical Therapy Evaluation Patient Details Name: Ethan Ayers MRN: [id number] DOB: 1940/01/31 Today's Date: 11/20/2020  History of Present Illness  Ethan Ayers is a 80 y.o. Caucasian male with medical history significant for carotid stenosis status post left carotid artery stent on 9/6 at which time he had a stroke with residual slurred speech, atrial fibrillation on Eliquis, hypertension and BPH, presented to the ER with acute onset of ataxia as well as slurred speech with associated left-sided weakness and altered mental status with confusion that started around 1 AM.  He admitted to headache without dizziness or blurred vision or diplopia. Pt with agitiation/combative overnight and received medications   Clinical Impression  Pt is a pleasant 80 year old male who presents to the ER for AMS and slurred speech. During evaluation, pt very lethargic and disoriented to place and situation likely due to medication and accompanied by his spouse who provided history. Spouse reporting that pt had been receiving outpatient PT prior to admission after his last admission and recently able to ambulate without an assistive device. Spouse reporting that he has been acting very outside of his character starting last night. Upon evaluation, pt requiring maxA for bed mobility, modA + bilateral handheld assistance to stand at EOB, and unable to take sidesteps at EOB due to poor cognition. Pt demonstrating poor command following, seated/standing balance, and impaired cognition placing him at a high fall risk. Recommending STR at discharge to improve functional mobility and safety to decrease caregiver burden. Pt will benefit from skilled PT services to improve mobility to decrease fall risk and improve safety.     Recommendations for follow up therapy are one component of a multi-disciplinary discharge planning process, led by the attending physician.  Recommendations may be updated based on patient status,  additional functional criteria and insurance authorization.  Follow Up Recommendations Skilled nursing-short term rehab (<3 hours/day)    Assistance Recommended at Discharge Frequent or constant Supervision/Assistance  Functional Status Assessment Patient has had a recent decline in their functional status and demonstrates the ability to make significant improvements in function in a reasonable and predictable amount of time.  Equipment Recommendations  None recommended by PT    Recommendations for Other Services       Precautions / Restrictions Precautions Precautions: Fall Restrictions Weight Bearing Restrictions: No      Mobility  Bed Mobility Overal bed mobility: Needs Assistance Bed Mobility: Supine to Sit;Sit to Supine;Rolling Rolling: Mod assist   Supine to sit: Max assist Sit to supine: Max assist;+2 for physical assistance   General bed mobility comments: ModA to roll bilaterally with increased resistance while rolling towards R side. MaxA for bed mobility due to impaired cogntion.    Transfers Overall transfer level: Needs assistance Equipment used: 2 person hand held assist Transfers: Sit to/from Stand Sit to Stand: From elevated surface;+2 safety/equipment;Mod assist           General transfer comment: +2 assist from standing from significantly elevated surface. Pt demonstrating difficulty with maintaining standing posture for >30 seconds and bracing with back of legs on bed surface    Ambulation/Gait             General Gait Details: Deferred due to safety. Attempted side stepping but pt unable to complete  Stairs            Wheelchair Mobility    Modified Rankin (Stroke Patients Only)       Balance Overall balance assessment: Needs assistance;History of Falls Sitting-balance  support: Bilateral upper extremity supported;Feet supported Sitting balance-Leahy Scale: Poor Sitting balance - Comments: Requires min-modA to maintain seated  posture. Pt unexpecting leaning posteriorly and requies assistance for trunk back to upright position   Standing balance support: Bilateral upper extremity supported;During functional activity;Reliant on assistive device for balance Standing balance-Leahy Scale: Poor Standing balance comment: Poor static standing balance; Unable to take steps                             Pertinent Vitals/Pain Pain Assessment: No/denies pain    Home Living Family/patient expects to be discharged to:: Private residence Living Arrangements: Spouse/significant other Available Help at Discharge: Family;Available 24 hours/day Type of Home: House Home Access: Stairs to enter Entrance Stairs-Rails: Left Entrance Stairs-Number of Steps: 3 Alternate Level Stairs-Number of Steps: 13 Home Layout: Two level;Bed/bath upstairs Home Equipment: Rolling Walker (2 wheels);Cane - single point;Shower seat Additional Comments: History obtained from spouse due to impaired cognition of patient + decreased arousal    Prior Function Prior Level of Function : Independent/Modified Independent             Mobility Comments: Spouse reports that pt had been going to PT to improve balance and walking. She reports that in the past 2 weeks the patient has been able to ambulate without a device ADLs Comments: Independent with ADLs; Assistance from wife for IADLs     Hand Dominance   Dominant Hand: Right    Extremity/Trunk Assessment        Lower Extremity Assessment Lower Extremity Assessment: Difficult to assess due to impaired cognition;Generalized weakness       Communication   Communication: Other (comment) (Unable to assess due to decreased arousal)  Cognition Arousal/Alertness: Lethargic;Suspect due to medications Behavior During Therapy: Restless Overall Cognitive Status: Impaired/Different from baseline Area of Impairment: Orientation;Attention;Following commands;Safety/judgement                  Orientation Level: Disoriented to;Place;Situation     Following Commands: Follows one step commands inconsistently Safety/Judgement: Decreased awareness of safety;Decreased awareness of deficits     General Comments: Difficulty staying alert during evaluation and some agitiation with movement in bed. Unable to answer prior level of function questioning        General Comments      Exercises Other Exercises Other Exercises: Pt assisted with changing of bed linens and chuck pads due to urinary incontinence. RN notified of removal of the external catheter during mobility   Assessment/Plan    PT Assessment Patient needs continued PT services  PT Problem List Decreased range of motion;Decreased strength;Decreased activity tolerance;Decreased balance;Decreased mobility;Decreased coordination;Decreased cognition;Decreased knowledge of use of DME;Decreased safety awareness       PT Treatment Interventions DME instruction;Gait training;Stair training;Functional mobility training;Therapeutic activities;Therapeutic exercise;Balance training;Neuromuscular re-education;Cognitive remediation;Patient/family education;Manual techniques;Modalities    PT Goals (Current goals can be found in the Care Plan section)  Acute Rehab PT Goals Patient Stated Goal: to be able to walk to bathroom with less assistance PT Goal Formulation: With family Time For Goal Achievement: 12/04/20 Potential to Achieve Goals: Fair    Frequency Min 2X/week   Barriers to discharge        Co-evaluation               AM-PAC PT "6 Clicks" Mobility  Outcome Measure Help needed turning from your back to your side while in a flat bed without using bedrails?: A Lot Help needed moving from  lying on your back to sitting on the side of a flat bed without using bedrails?: Total Help needed moving to and from a bed to a chair (including a wheelchair)?: Total Help needed standing up from a chair using your arms  (e.g., wheelchair or bedside chair)?: A Lot Help needed to walk in hospital room?: Total Help needed climbing 3-5 steps with a railing? : Total 6 Click Score: 8    End of Session   Activity Tolerance: Patient limited by lethargy Patient left: in bed;with call bell/phone within reach;with bed alarm set;with family/visitor present Nurse Communication: Mobility status PT Visit Diagnosis: Unsteadiness on feet (R26.81);Other abnormalities of gait and mobility (R26.89);Muscle weakness (generalized) (M62.81)    Time: 0102-7253 PT Time Calculation (min) (ACUTE ONLY): 22 min   Charges:   PT Evaluation $PT Eval High Complexity: 1 High PT Treatments $Therapeutic Activity: 8-22 mins        Verl Blalock, SPT   Verl Blalock 11/20/2020, 1:19 PM

## 2020-11-20 NOTE — ED Notes (Signed)
Pt still fighting with staff and not redirectable post Ativan admin. Pt taken back to room 18 at this time. MD Ward notified that MRI was unable to be completed

## 2020-11-20 NOTE — ED Notes (Signed)
Pt did not understand to swallow pills rather than chew. Gave pills in applesauce.

## 2020-11-20 NOTE — ED Provider Notes (Signed)
[facility] Emergency Department Provider Note  ____________________________________________   Event Date/Time   First MD Initiated Contact with Patient 11/20/20 0220     (approximate)  I have reviewed the triage vital signs and the nursing notes.   HISTORY  Chief Complaint Headache    HPI Ethan Ayers is a 80 y.o. male with history of atrial fibrillation on Eliquis, TIA, hypertension, carotid stenosis on Plavix who presents to the emergency department with his wife with concerns for possible stroke.  She states that he went to bed around 9 PM and was in his normal state of health.  She states he got up at 11 PM and was also still acting normal.  She states at 1 AM she heard him get up from bed and heard him stumbling.  She went to check on him and she noted that he had slurred speech and appeared to have left upper extremity weakness and had a staggering gait.  She states he has also been confused.  No known history of dementia.  He is complaining of a mild headache.  No chest pain or shortness of breath.  He denies any numbness or tingling.        Past Medical History:  Diagnosis Date   A-fib [facility])    BPH (benign prostatic hyperplasia)    Hypertension    TIA (transient ischemic attack)     Patient Active Problem List   Diagnosis Date Noted   Frequent nosebleeds 11/17/2020   Essential hypertension 11/17/2020   Carotid stenosis, symptomatic, with infarction (HCC) 09/30/2020   Carotid stenosis 09/30/2020   Altered mental status 09/22/2020    Past Surgical History:  Procedure Laterality Date   CAROTID PTA/STENT INTERVENTION Left 09/30/2020   Procedure: CAROTID PTA/STENT INTERVENTION;  Surgeon: Annice Needy, MD;  Location: [facility] INVASIVE CV LAB;  Service: Cardiovascular;  Laterality: Left;   TONSILLECTOMY      Prior to Admission medications   Medication Sig Start Date End Date Taking? Authorizing Provider  atorvastatin (LIPITOR) 40 MG tablet  Take 1 tablet (40 mg total) by mouth at bedtime. Patient taking differently: Take 10 mg by mouth at bedtime. 09/24/20 11/20/20 Yes Jerald Kief, MD  clopidogrel (PLAVIX) 75 MG tablet Take 1 tablet (75 mg total) by mouth daily. 10/16/20 10/11/21 Yes Georgiana Spinner, NP  diltiazem (CARDIZEM CD) 120 MG 24 hr capsule Take 120 mg by mouth daily. 07/23/20  Yes [provider]  ELIQUIS 2.5 MG TABS tablet SMARTSIG:1 Tablet(s) By Mouth Every 12 Hours 11/05/20  Yes [provider]  Iron-Vitamin C (VITRON-C PO) Take by mouth daily.   Yes [provider]  tamsulosin (FLOMAX) 0.4 MG CAPS capsule Take 0.4 mg by mouth daily. 09/08/20  Yes [provider]  aspirin EC 81 MG EC tablet Take 1 tablet (81 mg total) by mouth daily at 6 (six) AM. Swallow whole. Patient not taking: No sig reported 10/02/20   Stegmayer, Ranae Plumber, PA-C    Allergies Patient has no known allergies.  No family history on file.  Social History Social History   Tobacco Use   Smoking status: Former    Types: Cigarettes    Quit date: 01/18/1976    Years since quitting: 44.8   Smokeless tobacco: Never  Substance Use Topics   Alcohol use: No   Drug use: No    Review of Systems Level 5 caveat secondary to confusion  ____________________________________________   PHYSICAL EXAM:  VITAL SIGNS: ED Triage  Vitals  Enc Vitals Group     BP 11/20/20 0215 (!) 166/90     Pulse Rate 11/20/20 0212 90     Resp 11/20/20 0212 17     Temp 11/20/20 0212 98.1 F (36.7 C)     Temp Source 11/20/20 0212 Oral     SpO2 11/20/20 0212 97 %     Weight --      Height --      Head Circumference --      Peak Flow --      Pain Score 11/20/20 0214 3     Pain Loc --      Pain Edu? --      Excl. in GC? --    CONSTITUTIONAL: Alert and oriented to person and place but not year and responds appropriately to questions intermittently.  Appears confused and is intermittently trying to hit nursing staff. HEAD:  Normocephalic, atraumatic EYES: Conjunctivae clear, pupils appear equal, EOM appear intact ENT: normal nose; moist mucous membranes NECK: Supple, normal ROM CARD: Irregularly irregular and minimally tachycardic; S1 and S2 appreciated; no murmurs, no clicks, no rubs, no gallops RESP: Normal chest excursion without splinting or tachypnea; breath sounds clear and equal bilaterally; no wheezes, no rhonchi, no rales, no hypoxia or respiratory distress, speaking full sentences ABD/GI: Normal bowel sounds; non-distended; soft, non-tender, no rebound, no guarding, no peritoneal signs, no hepatosplenomegaly BACK: The back appears normal EXT: Normal ROM in all joints; no deformity noted, no edema; no cyanosis SKIN: Normal color for age and race; warm; no rash on exposed skin NEURO: Slightly diminished grip strength of the left hand compared to the right.  He has drift of the left upper extremity and left lower extremity compared to the right.  Cranial nerves II through XII intact.  Normal speech.  Reports normal sensation diffusely. PSYCH: Intermittently agitated and trying to strike nursing staff.  ____________________________________________   LABS (all labs ordered are listed, but only abnormal results are displayed)  Labs Reviewed  CBC - Abnormal; Notable for the following components:      Result Value   RBC 4.20 (*)    Hemoglobin 12.8 (*)    HCT 38.8 (*)    RDW 16.6 (*)    All other components within normal limits  COMPREHENSIVE METABOLIC PANEL - Abnormal; Notable for the following components:   Potassium 3.4 (*)    Glucose, Bld 115 (*)    GFR, Estimated 58 (*)    All other components within normal limits  URINALYSIS, ROUTINE W REFLEX MICROSCOPIC - Abnormal; Notable for the following components:   Color, Urine STRAW (*)    APPearance CLEAR (*)    All other components within normal limits  CBG MONITORING, ED - Abnormal; Notable for the following components:   Glucose-Capillary 117 (*)     All other components within normal limits  RESP PANEL BY RT-PCR (FLU A&B, COVID) ARPGX2  PROTIME-INR  APTT  DIFFERENTIAL  ETHANOL  URINE DRUG SCREEN, QUALITATIVE ([facility] ONLY)   ____________________________________________  EKG   EKG Interpretation  Date/Time:  Friday November 20 2020 02:18:08 EDT Ventricular Rate:  97 PR Interval:    QRS Duration: 87 QT Interval:  348 QTC Calculation: 442 R Axis:   87 Text Interpretation: Atrial fibrillation Anteroseptal infarct, age indeterminate No significant change since last tracing Confirmed by Pryor Curia [phone]) on 11/20/2020 2:19:57 AM        ____________________________________________  RADIOLOGY Jessie Foot Tamme Mozingo, personally viewed and evaluated these images (plain radiographs)  as part of my medical decision making, as well as reviewing the written report by the radiologist.  ED MD interpretation: CT head shows no acute abnormality.  Official radiology report(s): CT HEAD CODE STROKE WO CONTRAST  Result Date: 11/20/2020 CLINICAL DATA:  Code stroke.  Left-sided weakness EXAM: CT HEAD WITHOUT CONTRAST TECHNIQUE: Contiguous axial images were obtained from the base of the skull through the vertex without intravenous contrast. COMPARISON:  09/22/2020 FINDINGS: Brain: There is no mass, hemorrhage or extra-axial collection. There is generalized atrophy without lobar predilection. Hypodensity of the white matter is most commonly associated with chronic microvascular disease. Vascular: No abnormal hyperdensity of the major intracranial arteries or dural venous sinuses. No intracranial atherosclerosis. Skull: The visualized skull base, calvarium and extracranial soft tissues are normal. Sinuses/Orbits: No fluid levels or advanced mucosal thickening of the visualized paranasal sinuses. No mastoid or middle ear effusion. The orbits are normal. ASPECTS ([place] Stroke Program Early CT Score) - Ganglionic level infarction (caudate, lentiform nuclei,  internal capsule, insula, M1-M3 cortex): 7 - Supraganglionic infarction (M4-M6 cortex): 3 Total score (0-10 with 10 being normal): 10 Review of the MIP images confirms the above findings IMPRESSION: 1. No acute intracranial abnormality. 2. Generalized atrophy and chronic microvascular ischemia. 3. ASPECTS is 10. These results were called by telephone at the time of interpretation on 11/20/2020 at 3:06 am to provider [facility] , who verbally acknowledged these results. Electronically Signed   By: Ulyses Jarred M.D.   On: 11/20/2020 03:06    ____________________________________________   PROCEDURES  Procedure(s) performed (including Critical Care):  Procedures  CRITICAL CARE Performed by: Cyril Mourning Behr Cislo   Total critical care time: 45 minutes  Critical care time was exclusive of separately billable procedures and treating other patients.  Critical care was necessary to treat or prevent imminent or life-threatening deterioration.  Critical care was time spent personally by me on the following activities: development of treatment plan with patient and/or surrogate as well as nursing, discussions with consultants, evaluation of patient's response to treatment, examination of patient, obtaining history from patient or surrogate, ordering and performing treatments and interventions, ordering and review of laboratory studies, ordering and review of radiographic studies, pulse oximetry and re-evaluation of patient's condition.  ____________________________________________   INITIAL IMPRESSION / ASSESSMENT AND PLAN / ED COURSE  As part of my medical decision making, I reviewed the following data within the [place] History obtained from family, Nursing notes reviewed and incorporated, Labs reviewed , EKG interpreted , Old EKG reviewed, Old chart reviewed, Discussed with admitting physician , A consult was requested and obtained from this/these consultant(s) Neurology, CT reviewed,  and Notes from prior ED visits         Patient here with left-sided weakness, slurred speech.  Has history of hypertension, previous TIA, carotid stenosis status post stent on Plavix, atrial fibrillation on Eliquis who presents to the emergency department with last known well of 11 PM.  Patient still has left-sided drift on my exam with an NIH stroke scale of 3.  Will activate code stroke.  ED PROGRESS  Patient seen and evaluated by the tele neurologist.  He states that this time he has an NIH stroke scale of 1 for mild dysarthria.  He states given fluctuating symptoms that seem to be improving and that patient is on Eliquis he has not a tPA candidate.  Recommends admission to the hospital, MRI of the brain.  Does not need CTAs currently as he is not LVO positive.  Will discuss with medicine for admission.  CT head shows no acute abnormality when reviewed by myself and radiology.  Labs and urine unremarkable.  3:36 AM Discussed patient's case with hospitalist, Dr. Sidney Ace.  I have recommended admission and patient (and family if present) agree with this plan. Admitting physician will place admission orders.   I reviewed all nursing notes, vitals, pertinent previous records and reviewed/interpreted all EKGs, lab and urine results, imaging (as available).  ____________________________________________   FINAL CLINICAL IMPRESSION(S) / ED DIAGNOSES  Final diagnoses:  Left-sided weakness  Altered mental status, unspecified altered mental status type  Dysarthria     ED Discharge Orders     None       *Please note:  Ethan Ayers was evaluated in Emergency Department on 11/20/2020 for the symptoms described in the history of present illness. He was evaluated in the context of the global COVID-19 pandemic, which necessitated consideration that the patient might be at risk for infection with the SARS-CoV-2 virus that causes COVID-19. Institutional protocols and algorithms that pertain to  the evaluation of patients at risk for COVID-19 are in a state of rapid change based on information released by regulatory bodies including the CDC and federal and state organizations. These policies and algorithms were followed during the patient's care in the ED.  Some ED evaluations and interventions may be delayed as a result of limited staffing during and the pandemic.*   Note:  This document was prepared using Dragon voice recognition software and may include unintentional dictation errors.    Stanely Sexson, Delice Bison, DO 11/20/20 [phone]

## 2020-11-20 NOTE — ED Notes (Signed)
Pt is attempting to climb OOB. Attending MD messaged to request IV ativan or other sedation. Wife at bedside. Pt supposed to go to MRI this morning.

## 2020-11-20 NOTE — ED Notes (Signed)
Patient to CT with this RN.

## 2020-11-20 NOTE — Progress Notes (Signed)
Eeg done 

## 2020-11-20 NOTE — Progress Notes (Signed)
Inpatient Rehab Admissions Coordinator:   Per therapy recommendations pt was screened for CIR candidacy by Estill Dooms, PT, DPT.  Note that [facility] medicare highly unlikely to approve CIR for current diagnosis of subclinical/unwitnessed seizure.  We can certainly place an order for a full assessment, but would also recommend SNF workup be completed simultaneously.  Will place an order per our protocol.   Estill Dooms, PT, DPT Admissions Coordinator [phone] 11/20/20  3:48 PM

## 2020-11-20 NOTE — ED Triage Notes (Signed)
Patient to ED from home via EMS for c/o possible stroke-like symptoms. Per EMS, LKW was approx 2100 last night. Per EMS wife states he woke up with slurred speech, but it resolved before they arrived on scene. Patient c/o headache since approx 1200 yesterday. Patient has hx of stroke and intermittent left sided weakness.

## 2020-11-20 NOTE — ED Notes (Signed)
Neurologist is at bedside performing neuro exam. Pt had MRI but did not tolerate well. Attending provider aware. Carotid u/s yet to be performed.

## 2020-11-20 NOTE — ED Notes (Signed)
MRI tech Deanna Artis called this RN to notify me that the pt is fighting and will not lay flat on the bed for the scan. MD Ward notified at this time- see orders

## 2020-11-20 NOTE — Consult Note (Signed)
NEURO HOSPITALIST CONSULT NOTE   Requestig physician: Dr. Roosevelt Locks  Reason for Consult: Left sided weakness and slurred speech  History obtained from:  Wife and Chart     HPI:                                                                                                                                          Ethan Ayers is an 80 y.o. male with a PMHx of atrial fibrillation on Eliquis, HTN, TIA, prior stroke with sequela of intermittent left sided weakness, left ICA stent (Sept 2022) on Plavix and ASA, and BPH who presented to the hospital overnight with new onset of diffuse weakness and slurred speech. He was noted by wife to be somewhat weaker on the left. No symptoms of jerking, twitching or posturing described by wife. LKN was 2100 last night. Wife states that when the patient woke up at about 1 AM, she heard him get up from the bed and make stumbling noises. She checked on him and noted slurred speech, LUE weakness and a staggering gait. She felt that he was confuses as well. EMS was called and by the time of their arrival the slurred speech had resolved. On arrival to the ED, Code Stroke was called. CT head was negative for acute abnormality. The patient was evaluated by Teleneurology.   Teleneurology impression was reviewed: "2 M, h/o HL, afib on eliquis, who awoke from sleep with slurred speech and L sided wkness that appear to be improving. NIHSS 1 for mild dysarthria on my eval. Not a thrombolytic candidate due to being on eliquis. Head CT neg for acute abnl. Will adm for stroke workup. Defer on angio/perfusion as sx not consistent with LVO."  Plan was to continue Eliquis and obtain stroke work up.   Patient later became combative requiring Ativan and then Haldol. He is now somewhat sleepy from the sedating medications.    Past Medical History:  Diagnosis Date   A-fib [facility])    BPH (benign prostatic hyperplasia)    Hypertension    TIA (transient ischemic attack)      Past Surgical History:  Procedure Laterality Date   CAROTID PTA/STENT INTERVENTION Left 09/30/2020   Procedure: CAROTID PTA/STENT INTERVENTION;  Surgeon: Algernon Huxley, MD;  Location: [facility];  Service: Cardiovascular;  Laterality: Left;   TONSILLECTOMY      No family history on file.           Social History:  reports that he quit smoking about 44 years ago. His smoking use included cigarettes. He has never used smokeless tobacco. He reports that he does not drink alcohol and does not use drugs.  No Known Allergies  HOME MEDICATIONS:  No current facility-administered medications on file prior to encounter.   Current Outpatient Medications on File Prior to Encounter  Medication Sig Dispense Refill   atorvastatin (LIPITOR) 40 MG tablet Take 1 tablet (40 mg total) by mouth at bedtime. (Patient taking differently: Take 10 mg by mouth at bedtime.) 30 tablet 0   clopidogrel (PLAVIX) 75 MG tablet Take 1 tablet (75 mg total) by mouth daily. 30 tablet 11   diltiazem (CARDIZEM CD) 120 MG 24 hr capsule Take 120 mg by mouth daily.     ELIQUIS 2.5 MG TABS tablet SMARTSIG:1 Tablet(s) By Mouth Every 12 Hours     Iron-Vitamin C (VITRON-C PO) Take by mouth daily.     tamsulosin (FLOMAX) 0.4 MG CAPS capsule Take 0.4 mg by mouth daily.     aspirin EC 81 MG EC tablet Take 1 tablet (81 mg total) by mouth daily at 6 (six) AM. Swallow whole. (Patient not taking: No sig reported) 30 tablet 11     ROS:                                                                                                                                       The patient is unable to provide a ROS due to somnolence.    Blood pressure (!) 161/102, pulse (!) 108, temperature 98.1 F (36.7 C), temperature source Oral, resp. rate (!) 21, height 5\' 11"  (1.803 m), weight 74.3 kg, SpO2 96 %.   General  Examination:                                                                                                       Physical Exam  HEENT-  Normocephalic with evidence for small old superficial contusion to the right temporal region.    Lungs- Respirations unlabored Extremities- No edema  Neurological Examination Mental Status: Somnolent (recently received 2 mg Haldol). Speech is sparse but fluent and nondysarthric. In the context of somnolence, he is oriented to "[place]", [place] and that he is in the hospital, but not to the day, month or year. Able to name a thumb but had difficulty naming a pinky finger and index finger. Able to follow about 50% of commands in the context of his somnolence.  Cranial Nerves: II: Not able to keep eyes open long enough for detailed visual fields assessment. Looks towards left and right when peripheral visual stimuli are presented in the left and right temporal visual fields. PERRL.  III,IV, VI: Eyes are conjugate. Has  a slight rightward gaze preference. Difficulty tracking to left but able to cross midline. Can direct saccades to the left and right, but less frequently towards the left.  V: Reacts to stimulation bilaterally VII: Slightly less prominent left NL fold.  VIII: Hearing intact to voice IX,X: No hoarseness XI: Tends to preferentially rotate head slightly to the right XII: Midline tongue when yawning. Did not protrude when asked.  Motor: RUE and RLE with normal movement in the context of drowsiness. Strength rated as 4+/5 proximally and distally LUE with decreased spontaneous movement, drops to bed quicker than on right when elevated and released, max resistance is 4-/5 proximally and distally LLE with decreased spontaneous movement than on the right and equivocal decreased resistance to examiner in the context of drowsiness with poor effort.  Sensory: Drifts in and out of sleep making sensory assessment non-definitive. Tends to react significantly less  briskly to left sided stimuli relative to the right.  Deep Tendon Reflexes: 2+ bilateral brachioradialis, biceps and patellae.  Cerebellar: Unable to assess definitively due to somnolence and difficulty following commands. Slow finger to nose on the right without ataxia. Does not follow command on the left.  Gait: Deferred   Lab Results: Basic Metabolic Panel: Recent Labs  Lab 11/20/20 0222  NA 138  K 3.4*  CL 101  CO2 25  GLUCOSE 115*  BUN 20  CREATININE 1.24  CALCIUM 9.3    CBC: Recent Labs  Lab 11/20/20 0222  WBC 9.0  NEUTROABS 6.4  HGB 12.8*  HCT 38.8*  MCV 92.4  PLT 264    Cardiac Enzymes: No results for input(s): CKTOTAL, CKMB, CKMBINDEX, TROPONINI in the last 168 hours.  Lipid Panel: Recent Labs  Lab 11/20/20 0644  CHOL 148  TRIG 50  HDL 62  CHOLHDL 2.4  VLDL 10  LDLCALC 76    Imaging: CT HEAD CODE STROKE WO CONTRAST  Result Date: 11/20/2020 CLINICAL DATA:  Code stroke.  Left-sided weakness EXAM: CT HEAD WITHOUT CONTRAST TECHNIQUE: Contiguous axial images were obtained from the base of the skull through the vertex without intravenous contrast. COMPARISON:  09/22/2020 FINDINGS: Brain: There is no mass, hemorrhage or extra-axial collection. There is generalized atrophy without lobar predilection. Hypodensity of the white matter is most commonly associated with chronic microvascular disease. Vascular: No abnormal hyperdensity of the major intracranial arteries or dural venous sinuses. No intracranial atherosclerosis. Skull: The visualized skull base, calvarium and extracranial soft tissues are normal. Sinuses/Orbits: No fluid levels or advanced mucosal thickening of the visualized paranasal sinuses. No mastoid or middle ear effusion. The orbits are normal. ASPECTS ([place] Stroke Program Early CT Score) - Ganglionic level infarction (caudate, lentiform nuclei, internal capsule, insula, M1-M3 cortex): 7 - Supraganglionic infarction (M4-M6 cortex): 3 Total score  (0-10 with 10 being normal): 10 Review of the MIP images confirms the above findings IMPRESSION: 1. No acute intracranial abnormality. 2. Generalized atrophy and chronic microvascular ischemia. 3. ASPECTS is 10. These results were called by telephone at the time of interpretation on 11/20/2020 at 3:06 am to provider [facility] , who verbally acknowledged these results. Electronically Signed   By: Deatra Robinson M.D.   On: 11/20/2020 03:06    Recent carotid ultrasound (11/1):  -Right Carotid: Velocities in the right ICA are consistent with a 1-39%  stenosis.  -Left Carotid: Velocities in the left ICA are consistent with a 1-39%  stenosis. Patent ICA/CCA stent with no evidence of restenosis.  -Vertebrals:  Bilateral vertebral arteries demonstrate antegrade flow.  -  Subclavians: Normal flow hemodynamics were seen in bilateral subclavian arteries.   Assessment: 80 year old male with multiple stroke risk factors including atrial fibrillation (on Eliquis) presenting with acute onset of left sided weakness, stumbling gait and left facial droop.  1. Exam reveals findings best referable to the right cerebral hemisphere, most likely secondary to stroke.  2. CT head: No acute intracranial abnormality. Generalized atrophy and chronic microvascular ischemia.  ASPECTS is 10. 3. MRI motion degraded study is negative for acute infarction. Prominent chronic small vessel ischemic changes and diffuse cerebral atrophy are noted.  4. Labs are not suggestive of a metabolic or infectious encephalopathy. UTOX is negative.  5. Given that MRI is negative for acute stroke to explain the patient's left sided weakness, possible subclinical or unwitnessed seizure with postictal state should be considered.   Recommendations: 1. EEG (ordered).  2. Frequent neuro checks 3. Avoid sedation. Would discontinue Haldol.  4. Obtain CTA of head and neck to assess for critical stenosis in the intracranial circulation, as well as for  possible severe stenosis in the cervical circulation which could have been missed by recent carotid ultrasound.  5. TTE with bubble study 6. Cardiac telemetry 7. Continue Plavix and ASA to prevent restenosis of the left carotid stent.  8. Continue Eliquis for secondary stroke prevention in the setting of a-fib 9. BP management with modified permissive HTN protocol given advanced age. Treat SBP if > 180. After 3 AM gradually reduce SBP to long term goal of 120-140. 10. PT/OT/Speech 11. HgbA1c, fasting lipid panel   Electronically signed: Dr. Kerney Elbe 11/20/2020, 8:13 AM

## 2020-11-20 NOTE — Progress Notes (Signed)
PROGRESS NOTE    [name]  [id number] DOB: [birth year] DOA: 11/20/2020 PCP: Rusty Aus, MD   Chief complaint.  Altered mental status. Brief Narrative:  Ethan Ayers is a 80 y.o. Caucasian male with medical history significant for carotid stenosis status post left carotid artery stent on 9/6 at which time he had a stroke with residual slurred speech, atrial fibrillation on Eliquis, hypertension and BPH, presented to the ER with acute onset of ataxia as well as slurred speech with associated left-sided weakness and altered mental status with confusion that started around 1 AM. Noncontrasted CT head did not show any acute changes. Upon arriving the hospital, patient had a significant agitation and confusion, received Ativan.   Assessment & Plan:   Active Problems:   TIA (transient ischemic attack)  TIA. Acute encephalopathy. Chronic atrial fibrillation Patient has been seen by teleneurology last night, will be seen again by neurology. Patient had a brain MRI done earlier this morning, images degraded due to motion artifacts. Not able to perform another MRI due to persistent agitation. Will continue as needed Haldol, evening time Seroquel.  We will try to schedule another MRI tomorrow if patient mental status improved. Patient has a negative UA for UTI, will also check ammonia level, TSH and B12 level. Patient had a recent carotid artery stent 09/2020, will obtain another duplex ultrasound to evaluate. Patient currently on Plavix and Eliquis.  We will continue.  Essential hypertension. Permissive hypertension to 220/120.   DVT prophylaxis: Eliquis Code Status: full Family Communication: Wife updated at bedside. Disposition Plan:    Status is: Observation         No intake/output data recorded. No intake/output data recorded.     Consultants:  Neurology  Procedures: None  Antimicrobials: None  Subjective: Patient was very agitated last night,  received Ativan.  He still very confused.  But able to cooperate with PT/OT and speech therapy. Denies any short of breath or cough. No abdominal pain nausea vomiting. No dysuria or hematuria.  Objective: Vitals:   11/20/20 0730 11/20/20 0830 11/20/20 1041 11/20/20 1130  BP: (!) 156/91 (!) 150/98 (!) 159/89 (!) 159/105  Pulse: (!) 104 (!) 103 (!) 108 90  Resp: 20 20 20 20   Temp:      TempSrc:      SpO2: 95% 96% 94% 94%  Weight:      Height:       No intake or output data in the 24 hours ending 11/20/20 1406 Filed Weights   11/20/20 0310  Weight: 74.3 kg    Examination:  General exam: Appears calm and comfortable  Respiratory system: Clear to auscultation. Respiratory effort normal. Cardiovascular system: S1 & S2 heard, RRR. No JVD, murmurs, rubs, gallops or clicks. No pedal edema. Gastrointestinal system: Abdomen is nondistended, soft and nontender. No organomegaly or masses felt. Normal bowel sounds heard. Central nervous system: Alert and oriented x2. No focal neurological deficits. Extremities: Symmetric 5 x 5 power. Skin: No rashes, lesions or ulcers Psychiatry: Mood & affect appropriate.     Data Reviewed: I have personally reviewed following labs and imaging studies  CBC: Recent Labs  Lab 11/20/20 0222  WBC 9.0  NEUTROABS 6.4  HGB 12.8*  HCT 38.8*  MCV 92.4  PLT [id number]   Basic Metabolic Panel: Recent Labs  Lab 11/20/20 0222  NA 138  K 3.4*  CL 101  CO2 25  GLUCOSE 115*  BUN 20  CREATININE 1.24  CALCIUM 9.3   GFR:  Estimated Creatinine Clearance: 48.3 mL/min (by C-G formula based on SCr of 1.24 mg/dL). Liver Function Tests: Recent Labs  Lab 11/20/20 0222  AST 19  ALT 14  ALKPHOS 94  BILITOT 0.9  PROT 7.9  ALBUMIN 4.4   No results for input(s): LIPASE, AMYLASE in the last 168 hours. No results for input(s): AMMONIA in the last 168 hours. Coagulation Profile: Recent Labs  Lab 11/20/20 0222  INR 1.1   Cardiac Enzymes: No results for  input(s): CKTOTAL, CKMB, CKMBINDEX, TROPONINI in the last 168 hours. BNP (last 3 results) No results for input(s): PROBNP in the last 8760 hours. HbA1C: Recent Labs    11/20/20 0644  HGBA1C 5.4   CBG: Recent Labs  Lab 11/20/20 0224  GLUCAP 117*   Lipid Profile: Recent Labs    11/20/20 0644  CHOL 148  HDL 62  LDLCALC 76  TRIG 50  CHOLHDL 2.4   Thyroid Function Tests: No results for input(s): TSH, T4TOTAL, FREET4, T3FREE, THYROIDAB in the last 72 hours. Anemia Panel: No results for input(s): VITAMINB12, FOLATE, FERRITIN, TIBC, IRON, RETICCTPCT in the last 72 hours. Sepsis Labs: No results for input(s): PROCALCITON, LATICACIDVEN in the last 168 hours.  Recent Results (from the past 240 hour(s))  Resp Panel by RT-PCR (Flu A&B, Covid) Nasopharyngeal Swab     Status: None   Collection Time: 11/20/20  2:23 AM   Specimen: Nasopharyngeal Swab; Nasopharyngeal(NP) swabs in vial transport medium  Result Value Ref Range Status   SARS Coronavirus 2 by RT PCR NEGATIVE NEGATIVE Final    Comment: (NOTE) SARS-CoV-2 target nucleic acids are NOT DETECTED.  The SARS-CoV-2 RNA is generally detectable in upper respiratory specimens during the acute phase of infection. The lowest concentration of SARS-CoV-2 viral copies this assay can detect is 138 copies/mL. A negative result does not preclude SARS-Cov-2 infection and should not be used as the sole basis for treatment or other patient management decisions. A negative result may occur with  improper specimen collection/handling, submission of specimen other than nasopharyngeal swab, presence of viral mutation(s) within the areas targeted by this assay, and inadequate number of viral copies(<138 copies/mL). A negative result must be combined with clinical observations, patient history, and epidemiological information. The expected result is Negative.  Fact Sheet for Patients:  BloggerCourse.com  Fact Sheet for  Healthcare Providers:  SeriousBroker.it  This test is no t yet approved or cleared by the [place] FDA and  has been authorized for detection and/or diagnosis of SARS-CoV-2 by FDA under an Emergency Use Authorization (EUA). This EUA will remain  in effect (meaning this test can be used) for the duration of the COVID-19 declaration under Section 564(b)(1) of the Act, 21 U.S.C.section 360bbb-3(b)(1), unless the authorization is terminated  or revoked sooner.       Influenza A by PCR NEGATIVE NEGATIVE Final   Influenza B by PCR NEGATIVE NEGATIVE Final    Comment: (NOTE) The Xpert Xpress SARS-CoV-2/FLU/RSV plus assay is intended as an aid in the diagnosis of influenza from Nasopharyngeal swab specimens and should not be used as a sole basis for treatment. Nasal washings and aspirates are unacceptable for Xpert Xpress SARS-CoV-2/FLU/RSV testing.  Fact Sheet for Patients: BloggerCourse.com  Fact Sheet for Healthcare Providers: SeriousBroker.it  This test is not yet approved or cleared by the [place] FDA and has been authorized for detection and/or diagnosis of SARS-CoV-2 by FDA under an Emergency Use Authorization (EUA). This EUA will remain in effect (meaning this test can be used)  for the duration of the COVID-19 declaration under Section 564(b)(1) of the Act, 21 U.S.C. section 360bbb-3(b)(1), unless the authorization is terminated or revoked.  Performed at [facility], [address], [place], [place] [address]          Radiology Studies: MR BRAIN WO CONTRAST  Result Date: 11/20/2020 CLINICAL DATA:  Acute neuro deficit.  Left carotid stent 09/22/2020 EXAM: MRI HEAD WITHOUT CONTRAST TECHNIQUE: Multiplanar, multiecho pulse sequences of the brain and surrounding structures were obtained without intravenous contrast. COMPARISON:  MRI head 09/23/2020 FINDINGS: Brain: Patient not  able to hold still. There is significant motion on the study. The entire study was not completed. Diffusion-weighted imaging negative for acute infarct. There is moderate chronic microvascular ischemic change in the white matter. There is moderate atrophy. No mass or fluid collection. Vascular: Normal arterial flow voids at the skull base. Skull and upper cervical spine: Negative Sinuses/Orbits: Paranasal sinuses clear. Bilateral cataract extraction Other: None IMPRESSION: Motion degraded study. Negative for acute infarct. Electronically Signed   By: Franchot Gallo M.D.   On: 11/20/2020 09:47   CT HEAD CODE STROKE WO CONTRAST  Result Date: 11/20/2020 CLINICAL DATA:  Code stroke.  Left-sided weakness EXAM: CT HEAD WITHOUT CONTRAST TECHNIQUE: Contiguous axial images were obtained from the base of the skull through the vertex without intravenous contrast. COMPARISON:  09/22/2020 FINDINGS: Brain: There is no mass, hemorrhage or extra-axial collection. There is generalized atrophy without lobar predilection. Hypodensity of the white matter is most commonly associated with chronic microvascular disease. Vascular: No abnormal hyperdensity of the major intracranial arteries or dural venous sinuses. No intracranial atherosclerosis. Skull: The visualized skull base, calvarium and extracranial soft tissues are normal. Sinuses/Orbits: No fluid levels or advanced mucosal thickening of the visualized paranasal sinuses. No mastoid or middle ear effusion. The orbits are normal. ASPECTS ([place] Stroke Program Early CT Score) - Ganglionic level infarction (caudate, lentiform nuclei, internal capsule, insula, M1-M3 cortex): 7 - Supraganglionic infarction (M4-M6 cortex): 3 Total score (0-10 with 10 being normal): 10 Review of the MIP images confirms the above findings IMPRESSION: 1. No acute intracranial abnormality. 2. Generalized atrophy and chronic microvascular ischemia. 3. ASPECTS is 10. These results were called by telephone  at the time of interpretation on 11/20/2020 at 3:06 am to provider [facility] , who verbally acknowledged these results. Electronically Signed   By: Ulyses Jarred M.D.   On: 11/20/2020 03:06        Scheduled Meds:   stroke: mapping our early stages of recovery book   Does not apply Once   apixaban  2.5 mg Oral BID   atorvastatin  10 mg Oral QHS   clopidogrel  75 mg Oral Daily   diltiazem  120 mg Oral Daily   ferrous [id number] C-folic acid   Oral Daily   QUEtiapine  25 mg Oral QHS   tamsulosin  0.4 mg Oral Daily   Continuous Infusions:  sodium chloride 75 mL/hr at 11/20/20 0647     LOS: 0 days    Time spent: no charge    Sharen Hones, MD [facility]   To contact the attending provider between 7A-7P or the covering provider during after hours 7P-7A, please log into the web site [email or web address] and access using universal [place] password for that web site. If you do not have the password, please call the hospital operator.  11/20/2020, 2:06 PM

## 2020-11-20 NOTE — H&P (Signed)
[name]    MR#:  [id number]  DATE OF BIRTH:   30, 1942  DATE OF ADMISSION:  11/20/2020  PRIMARY CARE PHYSICIAN: Ethan Penton, MD   Patient is coming from: Home  REQUESTING/REFERRING PHYSICIAN: Ward, Ethan Hire, DO  CHIEF COMPLAINT:   Chief Complaint  Patient presents with   Headache    HISTORY OF PRESENT ILLNESS:  Ethan Ayers is a 80 y.o. Caucasian male with medical history significant for carotid stenosis status post left carotid artery stent on 9/6 at which time he had a stroke with residual slurred speech, atrial fibrillation on Eliquis, hypertension and BPH, presented to the ER with acute onset of ataxia as well as slurred speech with associated left-sided weakness and altered mental status with confusion that started around 1 AM.  He admitted to headache without dizziness or blurred vision or diplopia.  No chest pain or dyspnea or palpitations.  No cough or wheezing.  [name] wife was giving most of the history.  The patient was evaluated by teleneurology and was not thought to be a candidate for tPA being on Eliquis.  ED Course: When he came to the ER vital signs were within normal.  Labs revealed mild hypokalemia of 3.4 and a creatinine 1.24 with otherwise unremarkable CMP.  CBC showed mild anemia.  Influenza antigens and COVID-19 PCR came back negative.  UA was clear.  Urine drug screen was negative.  Alcohol level was less than 10.  INR was 1.1 PT 14.2 and PTT 28. EKG as reviewed by me : EKG showed atrial fibrillation with controlled tach responses 97 with Q waves anteroseptally and no acute changes Imaging: Noncontrasted CT scan revealed generalized atrophy and chronic microvascular ischemia with no acute intracranial normality.  The patient was given 1 mg of IV Ativan.  He will be admitted to a med monitored bed for further evaluation and management. PAST MEDICAL HISTORY:   Past Medical History:  Diagnosis Date   A-fib  [facility])    BPH (benign prostatic hyperplasia)    Hypertension    TIA (transient ischemic attack)     PAST SURGICAL HISTORY:   Past Surgical History:  Procedure Laterality Date   CAROTID PTA/STENT INTERVENTION Left 09/30/2020   Procedure: CAROTID PTA/STENT INTERVENTION;  Surgeon: Annice Needy, MD;  Location: [facility] INVASIVE CV LAB;  Service: Cardiovascular;  Laterality: Left;   TONSILLECTOMY      SOCIAL HISTORY:   Social History   Tobacco Use   Smoking status: Former    Types: Cigarettes    Quit date: 01/18/1976    Years since quitting: 44.8   Smokeless tobacco: Never  Substance Use Topics   Alcohol use: No    FAMILY HISTORY:   His father had a massive MI at age 32.  His mother had TIA.  DRUG ALLERGIES:  No Known Allergies  REVIEW OF SYSTEMS:   ROS As per history of present illness. All pertinent systems were reviewed above. Constitutional, HEENT, cardiovascular, respiratory, GI, GU, musculoskeletal, neuro, psychiatric, endocrine, integumentary and hematologic systems were reviewed and are otherwise negative/unremarkable except for positive findings mentioned above in the HPI.   MEDICATIONS AT HOME:   Prior to Admission medications   Medication Sig Start Date End Date Taking? Authorizing Provider  atorvastatin (LIPITOR) 40 MG tablet Take 1 tablet (40 mg total) by mouth at bedtime. Patient taking differently: Take 10 mg by mouth at bedtime. 09/24/20 11/20/20 Yes Jerald Kief, MD  clopidogrel (PLAVIX) 75 MG tablet Take 1 tablet (75 mg total) by mouth daily. 10/16/20 10/11/21 Yes Georgiana Spinner, NP  diltiazem (CARDIZEM CD) 120 MG 24 hr capsule Take 120 mg by mouth daily. 07/23/20  Yes [provider]  ELIQUIS 2.5 MG TABS tablet SMARTSIG:1 Tablet(s) By Mouth Every 12 Hours 11/05/20  Yes [provider]  Iron-Vitamin C (VITRON-C PO) Take by mouth daily.   Yes [provider]  tamsulosin (FLOMAX) 0.4 MG CAPS capsule Take 0.4 mg by mouth daily. 09/08/20  Yes  [provider]  aspirin EC 81 MG EC tablet Take 1 tablet (81 mg total) by mouth daily at 6 (six) AM. Swallow whole. Patient not taking: No sig reported 10/02/20   Stegmayer, Cala Bradford A, PA-C      VITAL SIGNS:  Blood pressure (!) 170/108, pulse 97, temperature 98.1 F (36.7 C), temperature source Oral, resp. rate (!) 21, height 5\' 11"  (1.803 m), weight 74.3 kg, SpO2 98 %.  PHYSICAL EXAMINATION:  Physical Exam  GENERAL:  80 y.o.-year-old Caucasian male patient lying in the bed with no acute distress.  EYES: Pupils equal, round, reactive to light and accommodation. No scleral icterus. Extraocular muscles intact.  HEENT: Head atraumatic, normocephalic. Oropharynx and nasopharynx clear.  NECK:  Supple, no jugular venous distention. No thyroid enlargement, no tenderness.  LUNGS: Normal breath sounds bilaterally, no wheezing, rales,rhonchi or crepitation. No use of accessory muscles of respiration.  CARDIOVASCULAR: Regular rate and rhythm, S1, S2 normal. No murmurs, rubs, or gallops.  ABDOMEN: Soft, nondistended, nontender. Bowel sounds present. No organomegaly or mass.  EXTREMITIES: No pedal edema, cyanosis, or clubbing.  NEUROLOGIC: Cranial nerves II through XII are intact except for mild slurred speech.. Muscle strength 3/5 in the left upper and lower extremities compared to 5/5 in the right upper and lower extremities.  Sensation intact. Gait not checked.  PSYCHIATRIC: The patient is alert and oriented x 3.  Normal affect and good eye contact. SKIN: No obvious rash, lesion, or ulcer.   LABORATORY PANEL:   CBC Recent Labs  Lab 11/20/20 0222  WBC 9.0  HGB 12.8*  HCT 38.8*  PLT 264   ------------------------------------------------------------------------------------------------------------------  Chemistries  Recent Labs  Lab 11/20/20 0222  NA 138  K 3.4*  CL 101  CO2 25  GLUCOSE 115*  BUN 20  CREATININE 1.24  CALCIUM 9.3  AST 19  ALT 14  ALKPHOS 94  BILITOT  0.9   ------------------------------------------------------------------------------------------------------------------  Cardiac Enzymes No results for input(s): TROPONINI in the last 168 hours. ------------------------------------------------------------------------------------------------------------------  RADIOLOGY:  CT HEAD CODE STROKE WO CONTRAST  Result Date: 11/20/2020 CLINICAL DATA:  Code stroke.  Left-sided weakness EXAM: CT HEAD WITHOUT CONTRAST TECHNIQUE: Contiguous axial images were obtained from the base of the skull through the vertex without intravenous contrast. COMPARISON:  09/22/2020 FINDINGS: Brain: There is no mass, hemorrhage or extra-axial collection. There is generalized atrophy without lobar predilection. Hypodensity of the white matter is most commonly associated with chronic microvascular disease. Vascular: No abnormal hyperdensity of the major intracranial arteries or dural venous sinuses. No intracranial atherosclerosis. Skull: The visualized skull base, calvarium and extracranial soft tissues are normal. Sinuses/Orbits: No fluid levels or advanced mucosal thickening of the visualized paranasal sinuses. No mastoid or middle ear effusion. The orbits are normal. ASPECTS [facility] Stroke Program Early CT Score) - Ganglionic level infarction (caudate, lentiform nuclei, internal capsule, insula, M1-M3 cortex): 7 - Supraganglionic infarction (M4-M6 cortex): 3 Total score (0-10 with 10 being normal): 10 Review of the  MIP images confirms the above findings IMPRESSION: 1. No acute intracranial abnormality. 2. Generalized atrophy and chronic microvascular ischemia. 3. ASPECTS is 10. These results were called by telephone at the time of interpretation on 11/20/2020 at 3:06 am to provider [facility] , who verbally acknowledged these results. Electronically Signed   By: Deatra Robinson M.D.   On: 11/20/2020 03:06      IMPRESSION AND PLAN:  Active Problems:   TIA (transient ischemic  attack)  1.  TIA, rule out evolving CVA with left-sided hemiparesis and slurred speech and altered mental status. - Patient will be admitted to a observation medical monitored bed. - We will follow neurochecks every 4 hours for 24 hours. - We will continue his Eliquis as well as Plavix and statin therapy. - Fasting lipids will be obtained in a.m. - PT/OT and ST consults will be obtained. - Brain MRI without contrast as well as bilateral carotid Doppler will be obtained. - She had a 2D echo with EF of 55 to 60% and grade 1 diastolic dysfunction showed no significant abnormality in September of this year. - Neurology consult will be obtained. - I no defined Dr. Otelia Limes about the patient.  2.  Dyslipidemia. - We will continue statin therapy.  3.  Chronic atrial fibrillation. - We will continue Cardizem CD and Eliquis.  4.  BPH. - We will continue Flomax.  DVT prophylaxis: Eliquis. Code Status: full code. Family Communication:  The plan of care was discussed in details with the patient (and family). I answered all questions. The patient agreed to proceed with the above mentioned plan. Further management will depend upon hospital course. Disposition Plan: Back to previous home environment Consults called: Neurology.   All the records are reviewed and case discussed with ED provider.  Status is: Observation   Dispo: The patient is from: Home              Anticipated d/c is to: Home              Patient currently is not medically stable to d/c.              Difficult to place patient: No  TOTAL TIME TAKING CARE OF THIS PATIENT: 55 minutes.     Hannah Beat M.D on 11/20/2020 at 4:32 AM  [facility]   From 7 PM-7 AM, contact night-coverage [email or web address]  CC: Primary care physician; Ethan Penton, MD

## 2020-11-20 NOTE — ED Notes (Signed)
Pt assisted to void with urinal. urine. Promofit placed on pt. Bed alarm placed. Wife at bedside.

## 2020-11-20 NOTE — ED Notes (Signed)
Pt to CT

## 2020-11-20 NOTE — ED Notes (Signed)
Pt taken to MRI at this time

## 2020-11-20 NOTE — ED Notes (Signed)
Activated a Code Stroke per Dr Elesa Massed. Spoke with Ruby @ [facility]. LKW 11:00pm

## 2020-11-20 NOTE — ED Notes (Signed)
PT at bedside.

## 2020-11-21 ENCOUNTER — Observation Stay
Admit: 2020-11-21 | Discharge: 2020-11-21 | Disposition: A | Discharge status: Home / Self Care | Payer: Medicare Other | Attending: Internal Medicine | Admitting: Internal Medicine

## 2020-11-21 DIAGNOSIS — I482 Chronic atrial fibrillation, unspecified: Secondary | ICD-10-CM

## 2020-11-21 DIAGNOSIS — R4182 Altered mental status, unspecified: Secondary | ICD-10-CM | POA: Diagnosis not present

## 2020-11-21 DIAGNOSIS — G9341 Metabolic encephalopathy: Secondary | ICD-10-CM

## 2020-11-21 LAB — BASIC METABOLIC PANEL
Anion gap: 7 (ref 5–15)
BUN: 19 mg/dL (ref 8–23)
CO2: 25 mmol/L (ref 22–32)
Calcium: 8.9 mg/dL (ref 8.9–10.3)
Chloride: 102 mmol/L (ref 98–111)
Creatinine, Ser: 1.28 mg/dL — ABNORMAL HIGH (ref 0.61–1.24)
GFR, Estimated: 56 mL/min — ABNORMAL LOW (ref >60)
Glucose, Bld: 88 mg/dL (ref 70–99)
Potassium: 3.2 mmol/L — ABNORMAL LOW (ref 3.5–5.1)
Sodium: 134 mmol/L — ABNORMAL LOW (ref 135–145)

## 2020-11-21 LAB — MAGNESIUM: Magnesium: 2.2 mg/dL (ref 1.7–2.4)

## 2020-11-21 LAB — ECHOCARDIOGRAM COMPLETE
AR max vel: 2.44 cm2
AV Peak grad: 7.5 mmHg
Ao pk vel: 1.37 m/s
Area-P 1/2: 4.83 cm2
Height: 71 in
S' Lateral: 3.23 cm
Weight: 2619.2 oz

## 2020-11-21 LAB — AMMONIA: Ammonia: 26 umol/L (ref 9–35)

## 2020-11-21 LAB — TSH: TSH: 2.613 u[IU]/mL (ref 0.350–4.500)

## 2020-11-21 MED ORDER — QUETIAPINE FUMARATE 25 MG PO TABS: 25.0000 mg | ORAL_TABLET | Freq: Every day | ORAL | 0 refills | Status: DC

## 2020-11-21 MED ORDER — VITAMIN B-12 1000 MCG PO TABS
2000.0000 ug | ORAL_TABLET | Freq: Every day | ORAL | Status: DC
  Administered 2020-11-21: 2000 ug via ORAL
  Filled 2020-11-21: qty 2

## 2020-11-21 MED ORDER — CYANOCOBALAMIN 2000 MCG PO TABS: 2000.0000 ug | ORAL_TABLET | Freq: Every day | ORAL | 0 refills | Status: AC

## 2020-11-21 MED ORDER — POTASSIUM CHLORIDE 10 MEQ/100ML IV SOLN
10.0000 meq | INTRAVENOUS | Status: AC
  Administered 2020-11-21 (×3): 10 meq via INTRAVENOUS
  Filled 2020-11-21 (×2): qty 100

## 2020-11-21 NOTE — Progress Notes (Addendum)
Subjective: Feels much better today. Left sided weakness resolved. Confusion improved.   Objective: Current vital signs: BP (!) 150/85   Pulse 94   Temp 98.9 F (37.2 C) (Oral)   Resp 17   Ht  (1.803 m)   Wt 74.3 kg   SpO2 97%   BMI 22.83 kg/m  Vital signs in last 24 hours: Temp:  [98.9 F (37.2 C)] 98.9 F (37.2 C) (11/04 2130) Pulse Rate:  [38-108] 94 (11/05 0830) Resp:  [16-20] 17 (11/05 0830) BP: (98-159)/(57-105) 150/85 (11/05 0830) SpO2:  [89 %-98 %] 97 % (11/05 0830)  Intake/Output from previous day: 11/04 0701 - 11/05 0700 In: -  Out: 600 [Urine:600] Intake/Output this shift: No intake/output data recorded. Nutritional status:  Diet Order             Diet Heart Room service appropriate? Yes; Fluid consistency: Thin  Diet effective now                  HEENT:  Normocephalic with evidence for small old superficial contusion to the right temporal region.    Lungs: Respirations unlabored Ext: No edema  Neurologic Exam: Ment: Significant improvement in level of consciousness and attention today. Speech is fluent and non-dysarthric. Naming and comprehension are intact. Oriented to the day of the week, the city and state, but not the month or year.  CN: Visual fields intact. EOMI. Face symmetric. Head midline.  Motor: 5/5 x 4 Sensory: Intact to FT x 4 Cerebellar: No ataxia with FNF bilaterally  Lab Results: Results for orders placed or performed during the hospital encounter of 11/20/20 (from the past 48 hour(s))  Ethanol     Status: None   Collection Time: 11/20/20  2:22 AM  Result Value Ref Range   Alcohol, Ethyl (B) <10 <10 mg/dL    Comment: (NOTE) Lowest detectable limit for serum alcohol is 10 mg/dL.  For medical purposes only. Performed at [facility], [address] [place], [place] [address]   Protime-INR     Status: None   Collection Time: 11/20/20  2:22 AM  Result Value Ref Range   Prothrombin Time 14.2 11.4 - 15.2  seconds   INR 1.1 0.8 - 1.2    Comment: (NOTE) INR goal varies based on device and disease states. Performed at [facility], [address] [place], [place] [address]   APTT     Status: None   Collection Time: 11/20/20  2:22 AM  Result Value Ref Range   aPTT 28 24 - 36 seconds    Comment: Performed at [facility], [address] [place], [place] [address]  CBC     Status: Abnormal   Collection Time: 11/20/20  2:22 AM  Result Value Ref Range   WBC 9.0 4.0 - 10.5 K/uL   RBC 4.20 (L) 4.22 - 5.81 MIL/uL   Hemoglobin 12.8 (L) 13.0 - 17.0 g/dL   HCT 91.4 (L) 78.2 - 95.6 %   MCV 92.4 80.0 - 100.0 fL   MCH 30.5 26.0 - 34.0 pg   MCHC 33.0 30.0 - 36.0 g/dL   RDW 21.3 (H) 08.6 - 57.8 %   Platelets 264 150 - 400 K/uL   nRBC 0.0 0.0 - 0.2 %    Comment: Performed at [facility], [address], [place], [place] [address]  Differential     Status: None   Collection Time: 11/20/20  2:22 AM  Result Value Ref Range   Neutrophils  Relative % 71 %   Neutro Abs 6.4 1.7 - 7.7 K/uL   Lymphocytes Relative 15 %   Lymphs Abs 1.3 0.7 - 4.0 K/uL   Monocytes Relative 8 %   Monocytes Absolute 0.7 0.1 - 1.0 K/uL   Eosinophils Relative 4 %   Eosinophils Absolute 0.4 0.0 - 0.5 K/uL   Basophils Relative 1 %   Basophils Absolute 0.1 0.0 - 0.1 K/uL   Immature Granulocytes 1 %   Abs Immature Granulocytes 0.05 0.00 - 0.07 K/uL    Comment: Performed at [facility], [address] [place], [place] [address]  Comprehensive metabolic panel     Status: Abnormal   Collection Time: 11/20/20  2:22 AM  Result Value Ref Range   Sodium 138 135 - 145 mmol/L   Potassium 3.4 (L) 3.5 - 5.1 mmol/L   Chloride 101 98 - 111 mmol/L   CO2 25 22 - 32 mmol/L   Glucose, Bld 115 (H) 70 - 99 mg/dL    Comment: Glucose reference range applies only to samples taken after fasting for at least 8 hours.   BUN 20 8 - 23 mg/dL   Creatinine, Ser 6.04 0.61 - 1.24 mg/dL   Calcium 9.3 8.9 -  54.0 mg/dL   Total Protein 7.9 6.5 - 8.1 g/dL   Albumin 4.4 3.5 - 5.0 g/dL   AST 19 15 - 41 U/L   ALT 14 0 - 44 U/L   Alkaline Phosphatase 94 38 - 126 U/L   Total Bilirubin 0.9 0.3 - 1.2 mg/dL   GFR, Estimated 58 (L) >60 mL/min    Comment: (NOTE) Calculated using the CKD-EPI Creatinine Equation (2021)    Anion gap 12 5 - 15    Comment: Performed at [facility], [address] [place], [place] [address]  Vitamin B12     Status: None   Collection Time: 11/20/20  2:22 AM  Result Value Ref Range   Vitamin B-12 350 180 - 914 pg/mL    Comment: (NOTE) This assay is not validated for testing neonatal or myeloproliferative syndrome specimens for Vitamin B12 levels. Performed at [facility], [address], [place], [place] [address]   Resp Panel by RT-PCR (Flu A&B, Covid) Nasopharyngeal Swab     Status: None   Collection Time: 11/20/20  2:23 AM   Specimen: Nasopharyngeal Swab; Nasopharyngeal(NP) swabs in vial transport medium  Result Value Ref Range   SARS Coronavirus 2 by RT PCR NEGATIVE NEGATIVE    Comment: (NOTE) SARS-CoV-2 target nucleic acids are NOT DETECTED.  The SARS-CoV-2 RNA is generally detectable in upper respiratory specimens during the acute phase of infection. The lowest concentration of SARS-CoV-2 viral copies this assay can detect is 138 copies/mL. A negative result does not preclude SARS-Cov-2 infection and should not be used as the sole basis for treatment or other patient management decisions. A negative result may occur with  improper specimen collection/handling, submission of specimen other than nasopharyngeal swab, presence of viral mutation(s) within the areas targeted by this assay, and inadequate number of viral copies(<138 copies/mL). A negative result must be combined with clinical observations, patient history, and epidemiological information. The expected result is Negative.  Fact Sheet for Patients:   BloggerCourse.com  Fact Sheet for Healthcare Providers:  SeriousBroker.it  This test is no t yet approved or cleared by the [place] FDA and  has been authorized for detection and/or diagnosis of SARS-CoV-2 by FDA under an Emergency Use Authorization (EUA). This  EUA will remain  in effect (meaning this test can be used) for the duration of the COVID-19 declaration under Section 564(b)(1) of the Act, 21 U.S.C.section 360bbb-3(b)(1), unless the authorization is terminated  or revoked sooner.       Influenza A by PCR NEGATIVE NEGATIVE   Influenza B by PCR NEGATIVE NEGATIVE    Comment: (NOTE) The Xpert Xpress SARS-CoV-2/FLU/RSV plus assay is intended as an aid in the diagnosis of influenza from Nasopharyngeal swab specimens and should not be used as a sole basis for treatment. Nasal washings and aspirates are unacceptable for Xpert Xpress SARS-CoV-2/FLU/RSV testing.  Fact Sheet for Patients: BloggerCourse.com  Fact Sheet for Healthcare Providers: SeriousBroker.it  This test is not yet approved or cleared by the [place] FDA and has been authorized for detection and/or diagnosis of SARS-CoV-2 by FDA under an Emergency Use Authorization (EUA). This EUA will remain in effect (meaning this test can be used) for the duration of the COVID-19 declaration under Section 564(b)(1) of the Act, 21 U.S.C. section 360bbb-3(b)(1), unless the authorization is terminated or revoked.  Performed at [facility], [address] [place], [place] [address]   CBG monitoring, ED     Status: Abnormal   Collection Time: 11/20/20  2:24 AM  Result Value Ref Range   Glucose-Capillary 117 (H) 70 - 99 mg/dL    Comment: Glucose reference range applies only to samples taken after fasting for at least 8 hours.  Urine Drug Screen, Qualitative     Status: None   Collection Time: 11/20/20   2:34 AM  Result Value Ref Range   Tricyclic, Ur Screen NONE DETECTED NONE DETECTED   Amphetamines, Ur Screen NONE DETECTED NONE DETECTED   MDMA (Ecstasy)Ur Screen NONE DETECTED NONE DETECTED   Cocaine Metabolite,Ur [place] NONE DETECTED NONE DETECTED   Opiate, Ur Screen NONE DETECTED NONE DETECTED   Phencyclidine (PCP) Ur S NONE DETECTED NONE DETECTED   Cannabinoid 50 Ng, Ur [place] NONE DETECTED NONE DETECTED   Barbiturates, Ur Screen NONE DETECTED NONE DETECTED   Benzodiazepine, Ur Scrn NONE DETECTED NONE DETECTED   Methadone Scn, Ur NONE DETECTED NONE DETECTED    Comment: (NOTE) Tricyclics + metabolites, urine    Cutoff 1000 ng/mL Amphetamines + metabolites, urine  Cutoff 1000 ng/mL MDMA (Ecstasy), urine              Cutoff 500 ng/mL Cocaine Metabolite, urine          Cutoff 300 ng/mL Opiate + metabolites, urine        Cutoff 300 ng/mL Phencyclidine (PCP), urine         Cutoff 25 ng/mL Cannabinoid, urine                 Cutoff 50 ng/mL Barbiturates + metabolites, urine  Cutoff 200 ng/mL Benzodiazepine, urine              Cutoff 200 ng/mL Methadone, urine                   Cutoff 300 ng/mL  The urine drug screen provides only a preliminary, unconfirmed analytical test result and should not be used for non-medical purposes. Clinical consideration and professional judgment should be applied to any positive drug screen result due to possible interfering substances. A more specific alternate chemical method must be used in order to obtain a confirmed analytical result. Gas chromatography / mass spectrometry (GC/MS) is the preferred confirm atory method. Performed at [facility], [address] [place], [place] [address]   Urinalysis, Routine w reflex microscopic Nasopharyngeal Swab     Status: Abnormal   Collection Time: 11/20/20  2:34 AM  Result Value Ref Range   Color, Urine STRAW (A) YELLOW   APPearance CLEAR (A) CLEAR   Specific Gravity, Urine 1.008 1.005 - 1.030   pH 7.0  5.0 - 8.0   Glucose, UA NEGATIVE NEGATIVE mg/dL   Hgb urine dipstick NEGATIVE NEGATIVE   Bilirubin Urine NEGATIVE NEGATIVE   Ketones, ur NEGATIVE NEGATIVE mg/dL   Protein, ur NEGATIVE NEGATIVE mg/dL   Nitrite NEGATIVE NEGATIVE   Leukocytes,Ua NEGATIVE NEGATIVE    Comment: Performed at [facility], [address] [place], [place] [address]  Hemoglobin A1c     Status: None   Collection Time: 11/20/20  6:44 AM  Result Value Ref Range   Hgb A1c MFr Bld 5.4 4.8 - 5.6 %    Comment: (NOTE) Pre diabetes:          5.7%-6.4%  Diabetes:              >6.4%  Glycemic control for   <7.0% adults with diabetes    Mean Plasma Glucose 108.28 mg/dL    Comment: Performed at [facility], [address], [place], [place] [address]  Lipid panel     Status: None   Collection Time: 11/20/20  6:44 AM  Result Value Ref Range   Cholesterol 148 0 - 200 mg/dL   Triglycerides 50 <784 mg/dL   HDL 62 >69 mg/dL   Total CHOL/HDL Ratio 2.4 RATIO   VLDL 10 0 - 40 mg/dL   LDL Cholesterol 76 0 - 99 mg/dL    Comment:        Total Cholesterol/HDL:CHD Risk Coronary Heart Disease Risk Table                     Men   Women  1/2 Average Risk   3.4   3.3  Average Risk       5.0   4.4  2 X Average Risk   9.6   7.1  3 X Average Risk  23.4   11.0        Use the calculated Patient Ratio above and the CHD Risk Table to determine the patient's CHD Risk.        ATP III CLASSIFICATION (LDL):  <100     mg/dL   Optimal  629-528  mg/dL   Near or Above                    Optimal  130-159  mg/dL   Borderline  413-244  mg/dL   High  >010     mg/dL   Very High Performed at [facility], [address] [place], [place] [address]   Basic metabolic panel     Status: Abnormal   Collection Time: 11/21/20  6:50 AM  Result Value Ref Range   Sodium 134 (L) 135 - 145 mmol/L   Potassium 3.2 (L) 3.5 - 5.1 mmol/L   Chloride 102 98 - 111 mmol/L   CO2 25 22 - 32 mmol/L   Glucose, Bld 88 70 - 99 mg/dL     Comment: Glucose reference range applies only to samples taken after fasting for at least 8 hours.   BUN 19 8 - 23 mg/dL   Creatinine, Ser 6.64 (H) 0.61 - 1.24 mg/dL   Calcium 8.9 8.9 - 40.3 mg/dL  GFR, Estimated 56 (L) >60 mL/min    Comment: (NOTE) Calculated using the CKD-EPI Creatinine Equation (2021)    Anion gap 7 5 - 15    Comment: Performed at [facility], [address] [place], [place] [address]  Magnesium     Status: None   Collection Time: 11/21/20  6:50 AM  Result Value Ref Range   Magnesium 2.2 1.7 - 2.4 mg/dL    Comment: Performed at [facility], [address] [place], [place] [address]  TSH     Status: None   Collection Time: 11/21/20  6:50 AM  Result Value Ref Range   TSH 2.613 0.350 - 4.500 uIU/mL    Comment: Performed by a 3rd Generation assay with a functional sensitivity of <=0.01 uIU/mL. Performed at [facility], [address] [place], [place] [address]   Ammonia     Status: None   Collection Time: 11/21/20  6:50 AM  Result Value Ref Range   Ammonia 26 9 - 35 umol/L    Comment: Performed at [facility], [address] [place], [place] [address]    Recent Results (from the past 240 hour(s))  Resp Panel by RT-PCR (Flu A&B, Covid) Nasopharyngeal Swab     Status: None   Collection Time: 11/20/20  2:23 AM   Specimen: Nasopharyngeal Swab; Nasopharyngeal(NP) swabs in vial transport medium  Result Value Ref Range Status   SARS Coronavirus 2 by RT PCR NEGATIVE NEGATIVE Final    Comment: (NOTE) SARS-CoV-2 target nucleic acids are NOT DETECTED.  The SARS-CoV-2 RNA is generally detectable in upper respiratory specimens during the acute phase of infection. The lowest concentration of SARS-CoV-2 viral copies this assay can detect is 138 copies/mL. A negative result does not preclude SARS-Cov-2 infection and should not be used as the sole basis for treatment or other patient management decisions. A negative result  may occur with  improper specimen collection/handling, submission of specimen other than nasopharyngeal swab, presence of viral mutation(s) within the areas targeted by this assay, and inadequate number of viral copies(<138 copies/mL). A negative result must be combined with clinical observations, patient history, and epidemiological information. The expected result is Negative.  Fact Sheet for Patients:  BloggerCourse.com  Fact Sheet for Healthcare Providers:  SeriousBroker.it  This test is no t yet approved or cleared by the [place] FDA and  has been authorized for detection and/or diagnosis of SARS-CoV-2 by FDA under an Emergency Use Authorization (EUA). This EUA will remain  in effect (meaning this test can be used) for the duration of the COVID-19 declaration under Section 564(b)(1) of the Act, 21 U.S.C.section 360bbb-3(b)(1), unless the authorization is terminated  or revoked sooner.       Influenza A by PCR NEGATIVE NEGATIVE Final   Influenza B by PCR NEGATIVE NEGATIVE Final    Comment: (NOTE) The Xpert Xpress SARS-CoV-2/FLU/RSV plus assay is intended as an aid in the diagnosis of influenza from Nasopharyngeal swab specimens and should not be used as a sole basis for treatment. Nasal washings and aspirates are unacceptable for Xpert Xpress SARS-CoV-2/FLU/RSV testing.  Fact Sheet for Patients: BloggerCourse.com  Fact Sheet for Healthcare Providers: SeriousBroker.it  This test is not yet approved or cleared by the [place] FDA and has been authorized for detection and/or diagnosis of SARS-CoV-2 by FDA under an Emergency Use Authorization (EUA). This EUA will remain in effect (meaning this test can be used) for the duration of the COVID-19 declaration under Section 564(b)(1) of  the Act, 21 U.S.C. section 360bbb-3(b)(1), unless the authorization is terminated  or revoked.  Performed at [facility], [address] [place], [place] [address]     Lipid Panel Recent Labs    11/20/20 0644  CHOL 148  TRIG 50  HDL 62  CHOLHDL 2.4  VLDL 10  LDLCALC 76    Studies/Results: CT ANGIO HEAD NECK W WO CM  Result Date: 11/20/2020 CLINICAL DATA:  Stroke/TIA, assess intracranial arteries. EXAM: CT ANGIOGRAPHY HEAD AND NECK TECHNIQUE: Multidetector CT imaging of the head and neck was performed using the standard protocol during bolus administration of intravenous contrast. Multiplanar CT image reconstructions and MIPs were obtained to evaluate the vascular anatomy. Carotid stenosis measurements (when applicable) are obtained utilizing NASCET criteria, using the distal internal carotid diameter as the denominator. CONTRAST:  75mL OMNIPAQUE IOHEXOL 350 MG/ML SOLN COMPARISON:  CT head 11/20/2020 and down 07/06/2020, MRA head 09/23/2020 FINDINGS: CT HEAD FINDINGS Brain: No acute infarction. No acute hemorrhage, mass, mass effect, or midline shift. Cerebral atrophy, not unexpected for age. Periventricular white matter changes, likely the sequela of chronic small vessel ischemic disease. Vascular: No hyperdense vessel. Skull: Normal. Negative for fracture or focal lesion. Low-density lesion on the right frontal scalp, unchanged. Sinuses: Imaged portions are clear. Status post bilateral lens replacements. Orbits: The mastoids are well aerated. Review of the MIP images confirms the above findings CTA NECK FINDINGS Aortic arch: Standard branching. Imaged portion shows no evidence of aneurysm or dissection. No significant stenosis of the major arch vessel origins. Moderate narrowing of the right subclavian artery, distal to the takeoff of the right common carotid, secondary to noncalcified plaque. Right carotid system: No evidence of dissection, stenosis (50% or greater) or occlusion. Significant noncalcified plaque in the proximal right internal carotid artery,  with focal ulceration (series 10, image 160), measuring up to 4 x 4 mm. Left carotid system: No evidence of dissection, stenosis (50% or greater) or occlusion. Stent in the distal common carotid artery and proximal internal carotid artery. Vertebral arteries: Severe focal narrowing at the origin of the left vertebral artery (series 10, image 99), which is otherwise patent. Focal non opacification at the origin of the right vertebral artery (series 10, image 100). Focal stenosis at the right V3/V4 transition (series 10, image 221). The right extracranial vertebral artery is otherwise patent. Skeleton: No acute osseous abnormality. Other neck: Negative. Upper chest: Paraseptal and centrilobular emphysema. No focal pulmonary opacity or pleural effusion. Review of the MIP images confirms the above findings CTA HEAD FINDINGS Anterior circulation: Moderate calcifications in the right greater than left supraclinoid ICAs, which are otherwise patent to the termini without other abnormality. Focal narrowing at the origin of the right A1. A1 segments patent. Normal anterior communicating artery. Anterior cerebral arteries are patent to their distal aspects. No M1 stenosis or occlusion. Normal MCA bifurcations. Distal MCA branches perfused and symmetric. Posterior circulation: Focal narrowing at the V3/V4 transition (series 10, image 221), with further short-segment narrowing of the right V4 (series 10, images 236-242). Near non opacification of the distal V4 proximal to the vertebrobasilar junction (series 10, images 240-252). Left dominant system, with the left vertebral artery widely patent to the vertebrobasilar junction. Posterior inferior cerebral arteries patent bilaterally. Basilar patent to its distal aspect. Superior cerebral arteries patent bilaterally. Focal narrowing at the origin of the left P1 (series 10, image 286 and 287). Focal narrowing in the proximal right P2 (series 10, image 286). More distal PCAs are  mildly irregular  but appear perfused. The posterior communicating arteries are not visualized. Venous sinuses: As permitted by contrast timing, patent. Anatomic variants: None significant. Review of the MIP images confirms the above findings IMPRESSION: 1. Noncalcified plaque in the proximal right ICA, which is not hemodynamically significant but does demonstrate a large focal ulceration. 2. Focal non opacification of the origin the right vertebral artery, which also demonstrates focal stenosis at the right V3/V4 transition and in the distal right V4. 3. Severe focal narrowing of the origin the left vertebral artery, which is otherwise patent. 4. Focal narrowing at the origin of the right A1, origin of the left P1, and in the proximal right P2. Electronically Signed   By: Wiliam Ke M.D.   On: 11/20/2020 18:14   MR BRAIN WO CONTRAST  Result Date: 11/20/2020 CLINICAL DATA:  Acute neuro deficit.  Left carotid stent 09/22/2020 EXAM: MRI HEAD WITHOUT CONTRAST TECHNIQUE: Multiplanar, multiecho pulse sequences of the brain and surrounding structures were obtained without intravenous contrast. COMPARISON:  MRI head 09/23/2020 FINDINGS: Brain: Patient not able to hold still. There is significant motion on the study. The entire study was not completed. Diffusion-weighted imaging negative for acute infarct. There is moderate chronic microvascular ischemic change in the white matter. There is moderate atrophy. No mass or fluid collection. Vascular: Normal arterial flow voids at the skull base. Skull and upper cervical spine: Negative Sinuses/Orbits: Paranasal sinuses clear. Bilateral cataract extraction Other: None IMPRESSION: Motion degraded study. Negative for acute infarct. Electronically Signed   By: Marlan Palau M.D.   On: 11/20/2020 09:47   EEG adult  Result Date: 11/20/2020 Trish Fountain, MD     11/20/2020  8:01 PM ROUTINE EEG REPORT DATE OF STUDY:  11/20/2020 from 15:55 to 16:40 HISTORY: 80yo man with  slurred speech and left sided weakness, evaluate for seizure as etiology. DESCRIPTION: During maximal wakefulness, the background was organized and continuous but mildly slow.  There was a posterior dominant alpha rhythm at 6-7 Hz which was not well seen over the right hemisphere. Spontaneous variability and reactivity to stimulation were present. No sleep structures were seen. There was a relative loss of faster frequencies over the right hemisphere. There was continuous polymorphic delta slowing over the right frontotemporal region between 1-2 Hz.  Occasionally this slowing appeared to be rhythmic at 1 Hz with no evolution for 10 seconds maximum. There were frequent bursts of generalized polymorphic delta slowing at 1-2 Hz.  These were rhythmic (GRDA) at 1 Hz at times for less than 10 seconds. No definite independent epileptiform discharges were recorded. No clinical or electrographic seizures were recorded.  Hyperventilation and photic stimulation were not performed. CLASSIFICATION (EEG IMPRESSION): Abnormal Significance III (awake- lethargic) Continuous slow, regional, right frontotemporal Intermittent rhythmic slow, regional, right frontotemporal (LRDA) Intermittent rhythmic slow, generalized (GRDA) Intermittent slow, generalized Background slow CLINICAL INTERPRETATION: 1) There is evidence of severe focal cerebral dysfunction located over the right frontotemporal region, and the rhythmic slowing seen there suggests a potential epileptogenic foci in this region.  This is of unclear etiology. 2) There is evidence of moderate to severe diffuse encephalopathy that is non-specific and could be due to postictal state or toxic metabolic derangements. 3) No seizures were seen on this recording, however this routine EEG cannot exclude subclinical seizures and long term EEG monitoring is recommended. Colby A. Senaida Ores, MD Neurology and Clinical Neurophysiology  CT HEAD CODE STROKE WO CONTRAST  Result Date:  11/20/2020 CLINICAL DATA:  Code stroke.  Left-sided weakness EXAM: CT  HEAD WITHOUT CONTRAST TECHNIQUE: Contiguous axial images were obtained from the base of the skull through the vertex without intravenous contrast. COMPARISON:  09/22/2020 FINDINGS: Brain: There is no mass, hemorrhage or extra-axial collection. There is generalized atrophy without lobar predilection. Hypodensity of the white matter is most commonly associated with chronic microvascular disease. Vascular: No abnormal hyperdensity of the major intracranial arteries or dural venous sinuses. No intracranial atherosclerosis. Skull: The visualized skull base, calvarium and extracranial soft tissues are normal. Sinuses/Orbits: No fluid levels or advanced mucosal thickening of the visualized paranasal sinuses. No mastoid or middle ear effusion. The orbits are normal. ASPECTS ([place] Stroke Program Early CT Score) - Ganglionic level infarction (caudate, lentiform nuclei, internal capsule, insula, M1-M3 cortex): 7 - Supraganglionic infarction (M4-M6 cortex): 3 Total score (0-10 with 10 being normal): 10 Review of the MIP images confirms the above findings IMPRESSION: 1. No acute intracranial abnormality. 2. Generalized atrophy and chronic microvascular ischemia. 3. ASPECTS is 10. These results were called by telephone at the time of interpretation on 11/20/2020 at 3:06 am to provider [facility] , who verbally acknowledged these results. Electronically Signed   By: Deatra Robinson M.D.   On: 11/20/2020 03:06    Medications: Scheduled:   stroke: mapping our early stages of recovery book   Does not apply Once   apixaban  2.5 mg Oral BID   atorvastatin  10 mg Oral QHS   clopidogrel  75 mg Oral Daily   diltiazem  120 mg Oral Daily   ferrous fumarate-b12-vitamic C-folic acid   Oral Daily   QUEtiapine  25 mg Oral QHS   tamsulosin  0.4 mg Oral Daily   Continuous:  sodium chloride 75 mL/hr at 11/20/20 1538   potassium chloride 10 mEq (11/21/20 0846)    EEG report conclusions:  1) There is evidence of severe focal cerebral dysfunction located over the right frontotemporal region, and the rhythmic slowing seen there suggests a potential epileptogenic foci in this region.  This is of unclear etiology. 2) There is evidence of moderate to severe diffuse encephalopathy that is non-specific and could be due to postictal state or toxic metabolic derangements. 3) No seizures were seen on this recording, however this routine EEG cannot exclude subclinical seizures and long term EEG monitoring is recommended.   CTA of head and neck: 1. Noncalcified plaque in the proximal right ICA, which is not hemodynamically significant but does demonstrate a large focal ulceration. 2. Focal non opacification of the origin the right vertebral artery, which also demonstrates focal stenosis at the right V3/V4 transition and in the distal right V4. 3. Severe focal narrowing of the origin the left vertebral artery, which is otherwise patent. 4. Focal narrowing at the origin of the right A1, origin of the left P1, and in the proximal right P2.  Assessment: 80 year old male with multiple stroke risk factors including atrial fibrillation (on Eliquis) presenting with acute onset of left sided weakness, stumbling gait and left facial droop.  1. Exam on Friday revealed findings best referable to the right cerebral hemisphere, most likely secondary to stroke. Exam today significantly improved with resolution of left sided weakness and level of alertness nearly at baseline.  2. CT head: No acute intracranial abnormality. Generalized atrophy and chronic microvascular ischemia.  ASPECTS is 10. 3. MRI motion degraded study is negative for acute infarction. Prominent chronic small vessel ischemic changes and diffuse cerebral atrophy are noted.  4. Given that no acute stroke is seen on MRI to  explain the patient's left sided weakness, a possible subclinical or unwitnessed seizure  with postictal state is now higher on the DDx. Further compatible with seizure as the underlying etiology are focal abnormalities on his EEG, with evidence of severe focal cerebral dysfunction located over the right frontotemporal region; the intermittent rhythmic slowing seen there suggests a potential epileptogenic focus in this region. However, TIA can also result in focal slowing of the EEG, which would be expected to resolve with follow up EEG.  5. Also noted on EEG is evidence of moderate to severe diffuse encephalopathy that is non-specific and could be due to postictal state or toxic metabolic derangements. No seizures were seen on the EEG recording. 6. TTE report is pending.  7. CTA of head reveals intracranial atherosclerotic narrowing.  8. CTA of neck reveals a noncalcified plaque in the proximal right ICA, which is not hemodynamically significant but does demonstrate a large focal ulceration.  9. Lipid panel normal. HgbA1c is normal.  10. The ulcerated right ICA plaque is ipsilateral to the side of the EEG abnormality and contralateral to the patient's presenting neurological deficit. Based on these findings, possible artery-to-artery embolization from the ulcerated plaque may have resulted in a TIA, which is felt to be the best explanation for the patient's presentation. 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     Recommendations: 1. Avoid sedation. Would discontinue Haldol.  2. Continue Plavix to prevent restenosis of the left carotid stent. Per family, ASA was stopped about 2 weeks ago by the patient's PCP and on follow up with Dr. Wyn Quaker (Vascular Surgery), ASA was not restarted. Would contact Dr. Wyn Quaker to determine if just Plavix and Eliquis are sufficient or if ASA needs to be restarted.  3. Continue Eliquis for secondary stroke prevention in  the setting of a-fib. Patient's wife states that she has just realized she was only giving it once per day instead of BID and will continue with the correct BID dosing he is on currently in the hospital, when she gets home.  4. BP management. Out of the permissive HTN time window.   5. PT/OT/Speech 6. Vascular Surgery consult. The patient may be a candidate for right CEA as management of the possibly unstable ulcerated right ICA plaque.  7. Outpatient Neurology follow up for repeat EEG.  8. Start PO supplementation for B12 level < 400. Would administer at 2 mg po qd and repeat level in 3 months.    LOS: 0 days   @Electronically  signed: Dr. 11/21/2020  8:56 AM

## 2020-11-21 NOTE — ED Notes (Signed)
Case manager came to speak with pt

## 2020-11-21 NOTE — TOC Transition Note (Signed)
Transition of Care [facility]) - CM/SW Discharge Note   Patient Details  Name: DERRIS MILLAN MRN: [id number] Date of Birth: Jan 26, 1940  Transition of Care [facility]) CM/SW Contact:  Hetty Ely, RN Phone Number: 11/21/2020, 3:47 PM   Clinical Narrative:  TOCRN spoke with patient and wife at bedside. Discussed HHPT/OT both receptive, no preference. Contacted [facility] to arrange, response pending. Agency to call wife Daveon Arpino, [phone].      Final next level of care: Home w Home Health Services Barriers to Discharge: Barriers Resolved   Patient Goals and CMS Choice Patient states their goals for this hospitalization and ongoing recovery are:: To return home CMS Medicare.gov Compare Post Acute Care list provided to:: Patient Choice offered to / list presented to : Patient, Spouse  Discharge Placement                Patient to be transferred to facility by: Home with wife Name of family member notified: Wife Patient and family notified of of transfer: 11/21/20  Discharge Plan and Services                          HH Arranged: PT, OT HH Agency: [facility] Date HH Agency Contacted: 11/21/20 Time HH Agency Contacted: 1547 Representative spoke with at [facility] Agency: Barbara Cower  Social Determinants of Health (SDOH) Interventions     Readmission Risk Interventions No flowsheet data found.

## 2020-11-21 NOTE — ED Notes (Signed)
Spoke with case manager who states they would be down to speak with pt in 15-20 minutes

## 2020-11-21 NOTE — Discharge Summary (Addendum)
Physician Discharge Summary  Patient ID: Ethan Ayers MRN: [id number] DOB/AGE: Mar 23, 1940 80 y.o.  Admit date: 11/20/2020 Discharge date: 11/21/2020  Admission Diagnoses:  Discharge Diagnoses:  Active Problems:   TIA (transient ischemic attack)   Discharged Condition: good  Hospital Course:  Ethan Ayers is a 80 y.o. Caucasian male with medical history significant for carotid stenosis status post left carotid artery stent on 9/6 at which time he had a stroke with residual slurred speech, atrial fibrillation on Eliquis, hypertension and BPH, presented to the ER with acute onset of ataxia as well as slurred speech with associated left-sided weakness and altered mental status with confusion that started around 1 AM. Noncontrasted CT head did not show any acute changes. Upon arriving the hospital, patient had a significant agitation and confusion, received Ativan.   TIA. Bilateral vertebral artery stenosis. Acute encephalopathy. Chronic atrial fibrillation Patient is evaluated by neurology, MRI did not show a stroke.  However, CT angiogram of the neck showed significant vertebral artery stenosis bilaterally.  Echocardiogram did not show any source of blood clots. Discussed with neurology, patient will be discharged home with aspirin, Plavix and Eliquis.  We will schedule follow-up with vascular surgeon in 2 weeks.   Essential hypertension. Resume home medicines.   Consults: neurology  Significant Diagnostic Studies:  Echo:   1. Left ventricular ejection fraction, by estimation, is 55 to 60%. The  left ventricle has normal function. The left ventricle has no regional  wall motion abnormalities. There is mild concentric left ventricular  hypertrophy. Left ventricular diastolic  parameters are consistent with Grade I diastolic dysfunction (impaired  relaxation).   2. Right ventricular systolic function is normal. The right ventricular  size is normal.   3. Left atrial size was  moderately dilated.   4. Right atrial size was mild to moderately dilated.   5. The mitral valve is normal in structure. Mild mitral valve  regurgitation. No evidence of mitral stenosis.   6. The aortic valve is normal in structure. Aortic valve regurgitation is  not visualized. Mild aortic valve sclerosis is present, with no evidence  of aortic valve stenosis.   7. The inferior vena cava is normal in size with greater than 50%  respiratory variability, suggesting right atrial pressure of 3 mmHg.   CT HEAD FINDINGS   Brain: No acute infarction. No acute hemorrhage, mass, mass effect, or midline shift. Cerebral atrophy, not unexpected for age. Periventricular white matter changes, likely the sequela of chronic small vessel ischemic disease.   Vascular: No hyperdense vessel.   Skull: Normal. Negative for fracture or focal lesion. Low-density lesion on the right frontal scalp, unchanged.   Sinuses: Imaged portions are clear. Status post bilateral lens replacements.   Orbits: The mastoids are well aerated.   Review of the MIP images confirms the above findings   CTA NECK FINDINGS   Aortic arch: Standard branching. Imaged portion shows no evidence of aneurysm or dissection. No significant stenosis of the major arch vessel origins. Moderate narrowing of the right subclavian artery, distal to the takeoff of the right common carotid, secondary to noncalcified plaque.   Right carotid system: No evidence of dissection, stenosis (50% or greater) or occlusion. Significant noncalcified plaque in the proximal right internal carotid artery, with focal ulceration (series 10, image 160), measuring up to 4 x 4 mm.   Left carotid system: No evidence of dissection, stenosis (50% or greater) or occlusion. Stent in the distal common carotid artery and proximal internal carotid  artery.   Vertebral arteries: Severe focal narrowing at the origin of the left vertebral artery (series 10, image  99), which is otherwise patent. Focal non opacification at the origin of the right vertebral artery (series 10, image 100). Focal stenosis at the right V3/V4 transition (series 10, image 221). The right extracranial vertebral artery is otherwise patent.   Skeleton: No acute osseous abnormality.   Other neck: Negative.   Upper chest: Paraseptal and centrilobular emphysema. No focal pulmonary opacity or pleural effusion.   Review of the MIP images confirms the above findings   CTA HEAD FINDINGS   Anterior circulation: Moderate calcifications in the right greater than left supraclinoid ICAs, which are otherwise patent to the termini without other abnormality. Focal narrowing at the origin of the right A1. A1 segments patent. Normal anterior communicating artery. Anterior cerebral arteries are patent to their distal aspects. No M1 stenosis or occlusion. Normal MCA bifurcations. Distal MCA branches perfused and symmetric.   Posterior circulation: Focal narrowing at the V3/V4 transition (series 10, image 221), with further short-segment narrowing of the right V4 (series 10, images 236-242). Near non opacification of the distal V4 proximal to the vertebrobasilar junction (series 10, images 240-252). Left dominant system, with the left vertebral artery widely patent to the vertebrobasilar junction. Posterior inferior cerebral arteries patent bilaterally. Basilar patent to its distal aspect. Superior cerebral arteries patent bilaterally. Focal narrowing at the origin of the left P1 (series 10, image 286 and 287). Focal narrowing in the proximal right P2 (series 10, image 286). More distal PCAs are mildly irregular but appear perfused. The posterior communicating arteries are not visualized.   Venous sinuses: As permitted by contrast timing, patent.   Anatomic variants: None significant.   Review of the MIP images confirms the above findings   IMPRESSION: 1. Noncalcified plaque in  the proximal right ICA, which is not hemodynamically significant but does demonstrate a large focal ulceration. 2. Focal non opacification of the origin the right vertebral artery, which also demonstrates focal stenosis at the right V3/V4 transition and in the distal right V4. 3. Severe focal narrowing of the origin the left vertebral artery, which is otherwise patent. 4. Focal narrowing at the origin of the right A1, origin of the left P1, and in the proximal right P2.     Electronically Signed   By: Wiliam Ke M.D.   On: 11/20/2020 18:14  MRI: Brain: Patient not able to hold still. There is significant motion on the study. The entire study was not completed.   Diffusion-weighted imaging negative for acute infarct. There is moderate chronic microvascular ischemic change in the white matter. There is moderate atrophy. No mass or fluid collection.   Vascular: Normal arterial flow voids at the skull base.   Skull and upper cervical spine: Negative   Sinuses/Orbits: Paranasal sinuses clear. Bilateral cataract extraction   Other: None   IMPRESSION: Motion degraded study.   Negative for acute infarct.     Electronically Signed   By: Marlan Palau M.D.   On: 11/20/2020 09:47   EEG: 1) There is evidence of severe focal cerebral dysfunction located over the right frontotemporal region, and the rhythmic slowing seen there suggests a potential epileptogenic foci in this region.  This is of unclear etiology. 2) There is evidence of moderate to severe diffuse encephalopathy that is non-specific and could be due to postictal state or toxic metabolic derangements. 3) No seizures were seen on this recording, however this routine EEG cannot exclude  subclinical seizures and long term EEG monitoring is recommended.   Colby A. Senaida Ores, MD Neurology and Clinical Neurophysiology  Treatments: Stroke workup  Discharge Exam: Blood pressure (!) 150/85, pulse 94, temperature 98.9  F (37.2 C), temperature source Oral, resp. rate 17, height 5\' 11"  (1.803 m), weight 74.3 kg, SpO2 97 %. General appearance: alert and cooperative Resp: clear to auscultation bilaterally Cardio: Irregular, no murmurs. GI: soft, non-tender; bowel sounds normal; no masses,  no organomegaly Extremities: extremities normal, atraumatic, no cyanosis or edema  Disposition: Discharge disposition: 01-Home or Self Care       Discharge Instructions     Diet - low sodium heart healthy   Complete by: As directed    Increase activity slowly   Complete by: As directed       Allergies as of 11/21/2020   No Known Allergies      Medication List     TAKE these medications    aspirin 81 MG EC tablet Take 1 tablet (81 mg total) by mouth daily at 6 (six) AM. Swallow whole.   atorvastatin 40 MG tablet Commonly known as: LIPITOR Take 1 tablet (40 mg total) by mouth at bedtime. What changed: how much to take   clopidogrel 75 MG tablet Commonly known as: PLAVIX Take 1 tablet (75 mg total) by mouth daily.   diltiazem 120 MG 24 hr capsule Commonly known as: CARDIZEM CD Take 120 mg by mouth daily.   Eliquis 2.5 MG Tabs tablet Generic drug: apixaban SMARTSIG:1 Tablet(s) By Mouth Every 12 Hours   QUEtiapine 25 MG tablet Commonly known as: SEROQUEL Take 1 tablet (25 mg total) by mouth at bedtime.   tamsulosin 0.4 MG Caps capsule Commonly known as: FLOMAX Take 0.4 mg by mouth daily.   VITRON-C PO Take by mouth daily.        Follow-up Information     13/05/2020, MD Follow up in 1 week(s).   Specialty: Internal Medicine Contact information: [phone] [facility] [address] [facility] [place] [phone]         [facility] Follow up in 2 week(s).   Contact information: [address] [place] [phone]        [phone], MD Follow up in 2 week(s).   Specialties:  Vascular Surgery, Radiology, Interventional Cardiology Contact information: [phone] [place] [phone]                 Signed: [phone] 11/21/2020, 12:21 PM

## 2020-11-21 NOTE — Progress Notes (Signed)
Inpatient Rehab Admissions Coordinator:  Consult received. Note pt is under observation status at this time. Pt may not have the medical necessity to warrant an inpatient rehab stay if they remain observation. If status were to change, [facility] will assess for candidacy.   Wolfgang Phoenix, MS, CCC-SLP Admissions Coordinator [phone]

## 2020-11-21 NOTE — Progress Notes (Signed)
Occupational Therapy Treatment Patient Details Name: Ethan Ayers MRN: [id number] DOB: [birth year] Today's Date: 11/21/2020   History of present illness Ethan Ayers is a 80 y.o. Caucasian male with medical history significant for carotid stenosis status post left carotid artery stent on 09/22/20 at which time he had a stroke with residual slurred speech, atrial fibrillation on Eliquis, hypertension and BPH. Pt presented to the ER 11/20/20 with acute onset of ataxia as well as slurred speech with associated left-sided weakness and altered mental status with confusion that started around 1 AM.  He admitted to headache without dizziness or blurred vision or diplopia. Pt with agitiation/combative overnight and received medications   OT comments  Mr Husmann was seen for OT treatment on this date. Upon arrival to room pt in bed, wife at bedside, agreeable to tx.  CGA for toileting in standing. CGA for tooth brushing standing sinkside - 1 moderate LOB requiring MIN A to correct. MOD I don/doff B socks seated on chair. CGA for sit<>stand - no physical assist from various heights but 1 minor LOB noted stand>sit, pt able to correct.   Pt and spouse instructed in home/routines modifications, falls prevention strategies, gait belt use, and DME recs. All questions answered. Pt making good progress toward goals. Pt continues to benefit from skilled OT services to maximize return to PLOF and minimize risk of future falls, injury, caregiver burden, and readmission. Will continue to follow POC. Discharge recommendation updated to reflect pt progress.     Recommendations for follow up therapy are one component of a multi-disciplinary discharge planning process, led by the attending physician.  Recommendations may be updated based on patient status, additional functional criteria and insurance authorization.    Follow Up Recommendations  Home health OT    Assistance Recommended at Discharge Intermittent  Supervision/Assistance  Equipment Recommendations  None recommended by OT    Recommendations for Other Services      Precautions / Restrictions Precautions Precautions: Fall Restrictions Weight Bearing Restrictions: No       Mobility Bed Mobility Overal bed mobility: Needs Assistance Bed Mobility: Supine to Sit;Sit to Supine     Supine to sit: Min assist Sit to supine: Min guard        Transfers Overall transfer level: Needs assistance Equipment used: 1 person hand held assist Transfers: Sit to/from Stand Sit to Stand: Min guard           General transfer comment: no physical assist from various heights - 1 minor LOB noted stand>sit able to correct     Balance Overall balance assessment: Needs assistance;History of Falls Sitting-balance support: Feet supported;No upper extremity supported Sitting balance-Leahy Scale: Good     Standing balance support: Single extremity supported;During functional activity Standing balance-Leahy Scale: Fair                             ADL either performed or assessed with clinical judgement   ADL Overall ADL's : Needs assistance/impaired                                       General ADL Comments: CGA for toileting in standing. CGA for tooth brushing standing sinkside - 1 moderate LOB requiring MIN A to correct. MOD I don/doff B socks seated on chair      Cognition Arousal/Alertness: Awake/alert Behavior During Therapy: Flat affect  Overall Cognitive Status: Impaired/Different from baseline Area of Impairment: Following commands;Safety/judgement;Problem solving                       Following Commands: Follows one step commands with increased time Safety/Judgement: Decreased awareness of safety;Decreased awareness of deficits   Problem Solving: Slow processing;Requires verbal cues General Comments: Poor insight into deficits, unable to state when finished urination           Exercises Exercises: Other exercises Other Exercises Other Exercises: Pt and spouse educated re: OT role, DME recs, d/c recs, falls prevention, HEP, home/routines modifications Other Exercises: LBD, toileting, sup<>sit, sit<>stand x3, sitting/standing balance/tolerance, tooth brushing           Pertinent Vitals/ Pain       Pain Assessment: No/denies pain   Frequency  Min 5X/week        Progress Toward Goals  OT Goals(current goals can now be found in the care plan section)  Progress towards OT goals: Progressing toward goals  Acute Rehab OT Goals Patient Stated Goal: to go home OT Goal Formulation: With patient/family Time For Goal Achievement: 12/05/20 Potential to Achieve Goals: Good ADL Goals Pt Will Perform Grooming: sitting;with modified independence Pt Will Perform Lower Body Dressing: with min assist;sitting/lateral leans Pt Will Transfer to Toilet: with mod assist;stand pivot transfer;bedside commode  Plan Frequency remains appropriate;Discharge plan needs to be updated       AM-PAC OT "6 Clicks" Daily Activity     Outcome Measure   Help from another person eating meals?: None Help from another person taking care of personal grooming?: A Little Help from another person toileting, which includes using toliet, bedpan, or urinal?: A Little Help from another person bathing (including washing, rinsing, drying)?: A Little Help from another person to put on and taking off regular upper body clothing?: None Help from another person to put on and taking off regular lower body clothing?: None 6 Click Score: 21    End of Session    OT Visit Diagnosis: Other abnormalities of gait and mobility (R26.89);Muscle weakness (generalized) (M62.81)   Activity Tolerance Patient tolerated treatment well;Patient limited by fatigue   Patient Left in bed;with call bell/phone within reach;with family/visitor present   Nurse Communication Mobility status        Time:  1275-1700 OT Time Calculation (min): 28 min  Charges: OT General Charges $OT Visit: 1 Visit OT Treatments $Self Care/Home Management : 23-37 mins  Kathie Dike, M.S. OTR/L  11/21/20, 1:41 PM  ascom [phone]

## 2020-11-21 NOTE — Progress Notes (Signed)
Physical Therapy Treatment Patient Details Name: Ethan Ayers MRN: [id number] DOB: 03/16/1940 Today's Date: 11/21/2020   History of Present Illness Ethan Ayers is a 80 y.o. Caucasian male with medical history significant for carotid stenosis status post left carotid artery stent on 09/22/20 at which time he had a stroke with residual slurred speech, atrial fibrillation on Eliquis, hypertension and BPH. Pt presented to the ER 11/20/20 with acute onset of ataxia as well as slurred speech with associated left-sided weakness and altered mental status with confusion that started around 1 AM.  He admitted to headache without dizziness or blurred vision or diplopia. Pt with agitiation/combative overnight and received medications    PT Comments    Pt seen for PT tx with wife present for session. Pt much more alert on this date but not oriented to time. Pt is able to complete bed mobility with supervision on this date & ambulate increased distances with RW & CGA. Pt's wife participates in hands on training & voices comfort with assisting pt. Educated pt & wife on various topics, see notes below. Pt did have 1 instance of LOB when turning to sit on EOB with wife assisting. Due to improvement in functional mobility d/c recommendations have been updated to HHPT with 24 hr supervision.    Recommendations for follow up therapy are one component of a multi-disciplinary discharge planning process, led by the attending physician.  Recommendations may be updated based on patient status, additional functional criteria and insurance authorization.  Follow Up Recommendations  Home health PT     Assistance Recommended at Discharge Frequent or constant Supervision/Assistance  Equipment Recommendations  None recommended by PT (pt already has RW)    Recommendations for Other Services       Precautions / Restrictions Precautions Precautions: Fall Restrictions Weight Bearing Restrictions: No     Mobility   Bed Mobility Overal bed mobility: Needs Assistance Bed Mobility: Supine to Sit;Sit to Supine     Supine to sit: Supervision;HOB elevated Sit to supine: Supervision;HOB elevated (extra time to elevate BLE onto bed)        Transfers Overall transfer level: Needs assistance Equipment used: Rolling walker (2 wheels) Transfers: Sit to/from Stand Sit to Stand: Min guard           General transfer comment: Cuing for safe hand placement - requires ongoing cuing/education    Ambulation/Gait Ambulation/Gait assistance: Min guard Gait Distance (Feet): 150 Feet Assistive device: Rolling walker (2 wheels) Gait Pattern/deviations: Trunk flexed;Decreased stride length Gait velocity: decreased   General Gait Details: Pt's wife provided hands on assist & voices comfort with assisting pt with gait.   Stairs Stairs:  (Educated pt & wife on need to defer stair negotiation to 2nd floor until they have practiced this with HHPT. PT & wife in agreement with need for son to assist pt up steps into house.)           Wheelchair Mobility    Modified Rankin (Stroke Patients Only)       Balance Overall balance assessment: Needs assistance;History of Falls Sitting-balance support: Feet supported;No upper extremity supported Sitting balance-Leahy Scale: Good Sitting balance - Comments: close supervision static sitting   Standing balance support: During functional activity;Bilateral upper extremity supported Standing balance-Leahy Scale: Fair Standing balance comment: CGA & BUE support on RW                            Cognition Arousal/Alertness: Awake/alert  Behavior During Therapy: Flat affect Overall Cognitive Status: Impaired/Different from baseline Area of Impairment: Following commands;Safety/judgement;Problem solving                 Orientation Level: Disoriented to;Time   Memory: Decreased recall of precautions;Decreased short-term memory Following  Commands: Follows one step commands with increased time Safety/Judgement: Decreased awareness of safety;Decreased awareness of deficits   Problem Solving: Slow processing;Requires verbal cues General Comments: More alert on this date compared to yesterday.        Exercises Other Exercises Other Exercises: Pt and spouse educated re: OT role, DME recs, d/c recs, falls prevention, HEP, home/routines modifications Other Exercises: LBD, toileting, sup<>sit, sit<>stand x3, sitting/standing balance/tolerance, tooth brushing    General Comments General comments (skin integrity, edema, etc.): Provided wife with personal gait belt, discussed home modifications to reduce fall risk (remove/secure throw rugs), recommendation to use RW & assist pt with gait at all times, HHPT f/u, need for 24 hr supervision. Pt's wife reports medical team states pt had a stroke (unsure of this after reviewing chart) & PT educated them on need to take meds as perscribed by MD as well as get checked out if pt has any sudden, significant changes in the future.      Pertinent Vitals/Pain Pain Assessment: No/denies pain    Home Living                          Prior Function            PT Goals (current goals can now be found in the care plan section) Acute Rehab PT Goals Patient Stated Goal: to be able to walk to bathroom with less assistance PT Goal Formulation: With family Time For Goal Achievement: 12/04/20 Potential to Achieve Goals: Fair Progress towards PT goals: Progressing toward goals    Frequency    Min 2X/week      PT Plan Discharge plan needs to be updated    Co-evaluation              AM-PAC PT "6 Clicks" Mobility   Outcome Measure  Help needed turning from your back to your side while in a flat bed without using bedrails?: A Little Help needed moving from lying on your back to sitting on the side of a flat bed without using bedrails?: A Little Help needed moving to and  from a bed to a chair (including a wheelchair)?: A Little Help needed standing up from a chair using your arms (e.g., wheelchair or bedside chair)?: A Little Help needed to walk in hospital room?: A Little Help needed climbing 3-5 steps with a railing? : A Lot 6 Click Score: 17    End of Session Equipment Utilized During Treatment: Gait belt Activity Tolerance: Patient tolerated treatment well Patient left: in bed;with call bell/phone within reach;with family/visitor present Nurse Communication: Mobility status PT Visit Diagnosis: Unsteadiness on feet (R26.81);Other abnormalities of gait and mobility (R26.89);Muscle weakness (generalized) (M62.81)     Time: 6948-5462 PT Time Calculation (min) (ACUTE ONLY): 26 min  Charges:  $Gait Training: 8-22 mins $Therapeutic Activity: 8-22 mins                     Aleda Grana, PT, DPT 11/21/20, 1:51 PM    Sandi Mariscal 11/21/2020, 1:50 PM

## 2020-11-21 NOTE — ED Notes (Signed)
US at bedside

## 2020-11-21 NOTE — Progress Notes (Signed)
*  PRELIMINARY RESULTS* Echocardiogram 2D Echocardiogram has been performed.  Lenor Coffin 11/21/2020, 11:39 AM

## 2021-02-23 ENCOUNTER — Ambulatory Visit (INDEPENDENT_AMBULATORY_CARE_PROVIDER_SITE_OTHER): Payer: Medicare Other

## 2021-02-23 ENCOUNTER — Encounter (INDEPENDENT_AMBULATORY_CARE_PROVIDER_SITE_OTHER): Payer: Self-pay | Admitting: Vascular Surgery

## 2021-02-23 ENCOUNTER — Other Ambulatory Visit: Payer: Self-pay

## 2021-02-23 ENCOUNTER — Ambulatory Visit (INDEPENDENT_AMBULATORY_CARE_PROVIDER_SITE_OTHER): Payer: Medicare Other | Admitting: Vascular Surgery

## 2021-02-23 VITALS — BP 122/78 | HR 83 | Resp 16 | Wt 160.4 lb

## 2021-02-23 DIAGNOSIS — R040 Epistaxis: Secondary | ICD-10-CM | POA: Diagnosis not present

## 2021-02-23 DIAGNOSIS — I10 Essential (primary) hypertension: Secondary | ICD-10-CM | POA: Diagnosis not present

## 2021-02-23 DIAGNOSIS — I63239 Cerebral infarction due to unspecified occlusion or stenosis of unspecified carotid arteries: Secondary | ICD-10-CM

## 2021-02-23 DIAGNOSIS — I482 Chronic atrial fibrillation, unspecified: Secondary | ICD-10-CM

## 2021-02-23 NOTE — Assessment & Plan Note (Signed)
Carotid duplex today shows 1 to 39% right ICA stenosis and a widely patent left carotid stent.  He is doing well.  Continue current medical regimen.  Recheck in 6 months.

## 2021-02-23 NOTE — Progress Notes (Signed)
MRN : [id number]  Ethan Ayers is a 81 y.o. (11-05-1940) male who presents with chief complaint of  Chief Complaint  Patient presents with   Follow-up    Ultrasound follow up  .  History of Present Illness: Patient returns in follow-up of his carotid disease.  He is about 5 months status post left carotid stent placement for high-grade symptomatic stenosis.  He is doing well.  He has had no further neurologic symptoms.  He also had a right maxillary artery embolization for intractable nosebleeds.  Although this did a good job for a couple of months keeping away nosebleeds, he has had recurrent nosebleeds and had to have this cauterized twice now. Carotid duplex today shows 1 to 39% right ICA stenosis and a widely patent left carotid stent.  Current Outpatient Medications  Medication Sig Dispense Refill   atorvastatin (LIPITOR) 40 MG tablet Take 1 tablet (40 mg total) by mouth at bedtime. (Patient taking differently: Take 10 mg by mouth at bedtime.) 30 tablet 0   carbidopa-levodopa (SINEMET IR) 10-100 MG tablet Take 1 tablet by mouth 3 (three) times daily.     Cholecalciferol (D3-1000) 25 MCG (1000 UT) capsule Take 1,000 Units by mouth daily.     clopidogrel (PLAVIX) 75 MG tablet Take 1 tablet (75 mg total) by mouth daily. 30 tablet 11   diltiazem (CARDIZEM CD) 120 MG 24 hr capsule Take 120 mg by mouth daily.     ELIQUIS 2.5 MG TABS tablet SMARTSIG:1 Tablet(s) By Mouth Every 12 Hours     Iron-Vitamin C (VITRON-C PO) Take by mouth daily.     lamoTRIgine (LAMICTAL) 25 MG tablet Take 50 mg by mouth 2 (two) times daily.     Multiple Vitamin (MULTIVITAMIN) tablet Take 1 tablet by mouth daily.     QUEtiapine (SEROQUEL) 25 MG tablet Take 1 tablet (25 mg total) by mouth at bedtime. 7 tablet 0   tamsulosin (FLOMAX) 0.4 MG CAPS capsule Take 0.4 mg by mouth daily.     vitamin B-12 2000 MCG tablet Take 1 tablet (2,000 mcg total) by mouth daily. 30 tablet 0   aspirin EC 81 MG EC tablet Take 1  tablet (81 mg total) by mouth daily at 6 (six) AM. Swallow whole. (Patient not taking: Reported on 11/17/2020) 30 tablet 11   No current facility-administered medications for this visit.    Past Medical History:  Diagnosis Date   A-fib [facility])    BPH (benign prostatic hyperplasia)    Hypertension    TIA (transient ischemic attack)     Past Surgical History:  Procedure Laterality Date   CAROTID PTA/STENT INTERVENTION Left 09/30/2020   Procedure: CAROTID PTA/STENT INTERVENTION;  Surgeon: Algernon Huxley, MD;  Location: [place] [facility];  Service: Cardiovascular;  Laterality: Left;   TONSILLECTOMY       Social History   Tobacco Use   Smoking status: Former    Types: Cigarettes    Quit date: 01/18/1976    Years since quitting: 45.1   Smokeless tobacco: Never  Substance Use Topics   Alcohol use: No   Drug use: No      No family history on file.   No Known Allergies   REVIEW OF SYSTEMS (Negative unless checked)  Constitutional: [] Weight loss  [] Fever  [] Chills Cardiac: [] Chest pain   [] Chest pressure   [] Palpitations   [] Shortness of breath when laying flat   [] Shortness of breath at rest   [] Shortness of breath with exertion.  Vascular:  [] Pain in legs with walking   [] Pain in legs at rest   [] Pain in legs when laying flat   [] Claudication   [] Pain in feet when walking  [] Pain in feet at rest  [] Pain in feet when laying flat   [] History of DVT   [] Phlebitis   [] Swelling in legs   [] Varicose veins   [] Non-healing ulcers Pulmonary:   [] Uses home oxygen   [] Productive cough   [] Hemoptysis   [] Wheeze  [] COPD   [] Asthma Neurologic:  [] Dizziness  [] Blackouts   [] Seizures   [x] History of stroke   [] History of TIA  [] Aphasia   [] Temporary blindness   [] Dysphagia   [] Weakness or numbness in arms   [] Weakness or numbness in legs Musculoskeletal:  [x] Arthritis   [] Joint swelling   [] Joint pain   [] Low back pain Hematologic:  [] Easy bruising  [] Easy bleeding   [] Hypercoagulable state    [] Anemic  [] Hepatitis Gastrointestinal:  [] Blood in stool   [] Vomiting blood  [] Gastroesophageal reflux/heartburn   [] Difficulty swallowing. Genitourinary:  [] Chronic kidney disease   [] Difficult urination  [x] Frequent urination  [] Burning with urination   [] Blood in urine Skin:  [] Rashes   [] Ulcers   [] Wounds Psychological:  [] History of anxiety   []  History of major depression.  Physical Examination  Vitals:   02/23/21 1357  BP: 122/78  Pulse: 83  Resp: 16  Weight: 160 lb 6.4 oz (72.8 kg)   Body mass index is 22.37 kg/m. Gen:  WD/WN, NAD Head: [place]/AT, No temporalis wasting. Ear/Nose/Throat: Hearing grossly intact, nares w/o erythema or drainage, trachea midline Eyes: Conjunctiva clear. Sclera non-icteric Neck: Supple.  No bruit  Pulmonary:  Good air movement, equal and clear to auscultation bilaterally.  Cardiac: RRR, No JVD Vascular:  Vessel Right Left  Radial Palpable Palpable               Musculoskeletal: M/S 5/5 throughout.  No deformity or atrophy.  No edema. Neurologic: CN 2-12 intact. Sensation grossly intact in extremities.  Symmetrical.  Speech is fluent. Motor exam as listed above. Psychiatric: Judgment intact, Mood & affect appropriate for pt's clinical situation. Dermatologic: No rashes or ulcers noted.  No cellulitis or open wounds.     CBC Lab Results  Component Value Date   WBC 9.0 11/20/2020   HGB 12.8 (L) 11/20/2020   HCT 38.8 (L) 11/20/2020   MCV 92.4 11/20/2020   PLT 264 11/20/2020    BMET    Component Value Date/Time   NA 134 (L) 11/21/2020 0650   K 3.2 (L) 11/21/2020 0650   CL 102 11/21/2020 0650   CO2 25 11/21/2020 0650   GLUCOSE 88 11/21/2020 0650   BUN 19 11/21/2020 0650   CREATININE 1.28 (H) 11/21/2020 0650   CALCIUM 8.9 11/21/2020 0650   GFRNONAA 56 (L) 11/21/2020 0650   CrCl cannot be calculated (Patient's most recent lab result is older than the maximum 21 days allowed.).  COAG Lab Results  Component Value Date   INR 1.1  11/20/2020   INR 3.6 (H) 09/22/2020    Radiology No results found.   Assessment/Plan Frequent nosebleeds The patient was having essentially intractable nosebleeds on the right side.  He has had recurrent nosebleeds and now has had cauterization.    Essential hypertension blood pressure control important in reducing the progression of atherosclerotic disease. On appropriate oral medications.  Carotid stenosis, symptomatic, with infarction ([facility]) Carotid duplex today shows 1 to 39% right ICA stenosis and a widely patent left carotid  stent.  He is doing well.  Continue current medical regimen.  Recheck in 6 months.    Leotis Pain, MD  02/23/2021 2:56 PM    This note was created with Dragon medical transcription system.  Any errors from dictation are purely unintentional

## 2021-05-26 ENCOUNTER — Telehealth (INDEPENDENT_AMBULATORY_CARE_PROVIDER_SITE_OTHER): Payer: Self-pay

## 2021-05-26 NOTE — Telephone Encounter (Signed)
It is ok if we hold his blood thinner for 5 days

## 2021-05-26 NOTE — Telephone Encounter (Signed)
Patient spouse was made aware with medical advice and verbalized understanding. 

## 2021-07-25 ENCOUNTER — Other Ambulatory Visit (INDEPENDENT_AMBULATORY_CARE_PROVIDER_SITE_OTHER): Payer: Self-pay | Admitting: Nurse Practitioner

## 2021-07-25 DIAGNOSIS — I63239 Cerebral infarction due to unspecified occlusion or stenosis of unspecified carotid arteries: Secondary | ICD-10-CM

## 2021-08-24 ENCOUNTER — Ambulatory Visit (INDEPENDENT_AMBULATORY_CARE_PROVIDER_SITE_OTHER): Payer: Medicare Other | Admitting: Vascular Surgery

## 2021-08-24 ENCOUNTER — Ambulatory Visit (INDEPENDENT_AMBULATORY_CARE_PROVIDER_SITE_OTHER): Payer: Medicare Other

## 2021-08-24 ENCOUNTER — Encounter (INDEPENDENT_AMBULATORY_CARE_PROVIDER_SITE_OTHER): Payer: Self-pay | Admitting: Vascular Surgery

## 2021-08-24 VITALS — BP 126/77 | HR 84 | Resp 18 | Ht 69.0 in | Wt 154.0 lb

## 2021-08-24 DIAGNOSIS — I63239 Cerebral infarction due to unspecified occlusion or stenosis of unspecified carotid arteries: Secondary | ICD-10-CM

## 2021-08-24 DIAGNOSIS — I482 Chronic atrial fibrillation, unspecified: Secondary | ICD-10-CM | POA: Diagnosis not present

## 2021-08-24 DIAGNOSIS — R040 Epistaxis: Secondary | ICD-10-CM | POA: Diagnosis not present

## 2021-08-24 DIAGNOSIS — I10 Essential (primary) hypertension: Secondary | ICD-10-CM | POA: Diagnosis not present

## 2021-08-24 NOTE — Assessment & Plan Note (Signed)
None recently after previous embolization of the maxillary branch as well as treatment by ENT.

## 2021-08-24 NOTE — Assessment & Plan Note (Signed)
His duplex today demonstrates 1 to 39% right ICA stenosis and a widely patent left carotid stent without evidence of recurrent stenosis.  He is doing well.  He is almost a year out from his intervention so we can go to an annual follow-up now.  Contact our office with any problems in the interim.

## 2021-08-24 NOTE — Progress Notes (Signed)
MRN : [id number]  Ethan Ayers is a 81 y.o. (04-17-1940) male who presents with chief complaint of No chief complaint on file. [name]  History of Present Illness: Patient returns in follow-up of his carotid disease.  He looks much better today than the last time I saw him 6 months ago.  His wife also comments how much better he has been doing over the past few months.  His nosebleeds are gone.  He is currently on aspirin and Lipitor.  He has had no focal neurologic symptoms since his stroke that prompted his carotid intervention almost a year ago.  His duplex today demonstrates 1 to 39% right ICA stenosis and a widely patent left carotid stent without evidence of recurrent stenosis.  Current Outpatient Medications  Medication Sig Dispense Refill   carbidopa-levodopa (SINEMET IR) 10-100 MG tablet Take 1 tablet by mouth 3 (three) times daily.     Cholecalciferol (D3-1000) 25 MCG (1000 UT) capsule Take 1,000 Units by mouth daily.     diltiazem (CARDIZEM CD) 120 MG 24 hr capsule Take 120 mg by mouth daily.     ELIQUIS 2.5 MG TABS tablet SMARTSIG:1 Tablet(s) By Mouth Every 12 Hours     Iron-Vitamin C (VITRON-C PO) Take by mouth daily.     lamoTRIgine (LAMICTAL) 25 MG tablet Take 50 mg by mouth 2 (two) times daily.     Multiple Vitamin (MULTIVITAMIN) tablet Take 1 tablet by mouth daily.     tamsulosin (FLOMAX) 0.4 MG CAPS capsule Take 0.4 mg by mouth daily.     vitamin B-12 2000 MCG tablet Take 1 tablet (2,000 mcg total) by mouth daily. 30 tablet 0   zinc gluconate 50 MG tablet Take 50 mg by mouth daily.     atorvastatin (LIPITOR) 40 MG tablet Take 1 tablet (40 mg total) by mouth at bedtime. (Patient taking differently: Take 10 mg by mouth at bedtime.) 30 tablet 0   No current facility-administered medications for this visit.    Past Medical History:  Diagnosis Date   A-fib [facility])    BPH (benign prostatic hyperplasia)    Hypertension    TIA (transient ischemic attack)     Past Surgical  History:  Procedure Laterality Date   CAROTID PTA/STENT INTERVENTION Left 09/30/2020   Procedure: CAROTID PTA/STENT INTERVENTION;  Surgeon: Annice Needy, MD;  Location: [facility];  Service: Cardiovascular;  Laterality: Left;   TONSILLECTOMY       Social History   Tobacco Use   Smoking status: Former    Types: Cigarettes    Quit date: 01/18/1976    Years since quitting: 45.6   Smokeless tobacco: Never  Substance Use Topics   Alcohol use: No   Drug use: No      No family history on file.   No Known Allergies   REVIEW OF SYSTEMS (Negative unless checked)  Constitutional: [] Weight loss  [] Fever  [] Chills Cardiac: [] Chest pain   [] Chest pressure   [] Palpitations   [] Shortness of breath when laying flat   [] Shortness of breath at rest   [] Shortness of breath with exertion. Vascular:  [] Pain in legs with walking   [] Pain in legs at rest   [] Pain in legs when laying flat   [] Claudication   [] Pain in feet when walking  [] Pain in feet at rest  [] Pain in feet when laying flat   [] History of DVT   [] Phlebitis   [] Swelling in legs   [] Varicose veins   [] Non-healing  ulcers Pulmonary:   [] Uses home oxygen   [] Productive cough   [] Hemoptysis   [] Wheeze  [] COPD   [] Asthma Neurologic:  [] Dizziness  [] Blackouts   [] Seizures   [x] History of stroke   [] History of TIA  [] Aphasia   [] Temporary blindness   [] Dysphagia   [] Weakness or numbness in arms   [] Weakness or numbness in legs Musculoskeletal:  [x] Arthritis   [] Joint swelling   [] Joint pain   [] Low back pain Hematologic:  [] Easy bruising  [] Easy bleeding   [] Hypercoagulable state   [] Anemic  [] Hepatitis Gastrointestinal:  [] Blood in stool   [] Vomiting blood  [] Gastroesophageal reflux/heartburn   [] Difficulty swallowing. Genitourinary:  [] Chronic kidney disease   [] Difficult urination  [x] Frequent urination  [] Burning with urination   [] Blood in urine Skin:  [] Rashes   [] Ulcers   [] Wounds Psychological:  [] History of anxiety   []   History of major depression.  Physical Examination  Vitals:   08/24/21 1105  BP: 126/77  Pulse: 84  Resp: 18  Weight: 154 lb (69.9 kg)  Height: 5\' 9"  (1.753 m)   Body mass index is 22.74 kg/m. Gen:  WD/WN, NAD Head: [place]/AT, No temporalis wasting. Ear/Nose/Throat: Hearing grossly intact, nares w/o erythema or drainage, trachea midline Eyes: Conjunctiva clear. Sclera non-icteric Neck: Supple.  No bruit  Pulmonary:  Good air movement, equal and clear to auscultation bilaterally.  Cardiac: RRR, No JVD Vascular:  Vessel Right Left  Radial Palpable Palpable           Musculoskeletal: M/S 5/5 throughout.  No deformity or atrophy. No edema. Neurologic: CN 2-12 intact. Sensation grossly intact in extremities.  Symmetrical.  Speech is fluent. Motor exam as listed above. Psychiatric: Judgment intact, Mood & affect appropriate for pt's clinical situation. Dermatologic: No rashes or ulcers noted.  No cellulitis or open wounds.     CBC Lab Results  Component Value Date   WBC 9.0 11/20/2020   HGB 12.8 (L) 11/20/2020   HCT 38.8 (L) 11/20/2020   MCV 92.4 11/20/2020   PLT 264 11/20/2020    BMET    Component Value Date/Time   NA 134 (L) 11/21/2020 0650   K 3.2 (L) 11/21/2020 0650   CL 102 11/21/2020 0650   CO2 25 11/21/2020 0650   GLUCOSE 88 11/21/2020 0650   BUN 19 11/21/2020 0650   CREATININE 1.28 (H) 11/21/2020 0650   CALCIUM 8.9 11/21/2020 0650   GFRNONAA 56 (L) 11/21/2020 0650   CrCl cannot be calculated (Patient's most recent lab result is older than the maximum 21 days allowed.).  COAG Lab Results  Component Value Date   INR 1.1 11/20/2020   INR 3.6 (H) 09/22/2020    Radiology No results found.   Assessment/Plan Essential hypertension blood pressure control important in reducing the progression of atherosclerotic disease. On appropriate oral medications.  Frequent nosebleeds None recently after previous embolization of the maxillary branch as well as  treatment by ENT.  Carotid stenosis, symptomatic, with infarction ([facility]) His duplex today demonstrates 1 to 39% right ICA stenosis and a widely patent left carotid stent without evidence of recurrent stenosis.  He is doing well.  He is almost a year out from his intervention so we can go to an annual follow-up now.  Contact our office with any problems in the interim.    , MD  08/24/2021 12:32 PM    This note was created with Dragon medical transcription system.  Any errors from dictation are purely unintentional

## 2022-08-08 NOTE — Progress Notes (Signed)
Cardiology Office Note  Date:  08/09/2022   ID:  Ethan Ayers, DOB 08/28/1940, MRN [id number]  PCP:  Danella Penton, MD   Chief Complaint  Patient presents with   New Patient (Initial Visit)    Referred by PCP for Afib. Meds reviewed verbally with patient.     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 Right atrial pressure of 3 mmHg.   [facility] ED with stroke on 09/22/20. MRI and MRA brain showed severely stenotic distal right vertebral artery with poor flow.  Carotid Dopplers notable for heterogeneous plaque at the left carotid bifurcation contributing to 50 to 69% stenosis  Left carotid artery Exact stent with the use of the NAV-6 embolic protection device placement on 09/30/20 by Dr. Festus Barren    Plavix and Aspirin until 2023 when he had 4 major nosebleeds, which required cauterization to the right maxillary artery x2.  Plavix and Aspirin were D/C'd by Vascular.   carotid US on 08/24/21 showed 1 to 39% right ICA stenosis and widely patent left carotid stent without instent stenosis.  on atorvastatin 40mg  daily.   EKG personally reviewed by myself on todays visit EKG Interpretation Date/Time:  Tuesday August 09 2022 10:46:08 EDT Ventricular Rate:  83 PR Interval:    QRS Duration:  70 QT Interval:  368 QTC Calculation: 432 R Axis:   60  Text Interpretation: Atrial fibrillation Low voltage QRS Septal infarct , age undetermined When compared with ECG of 20-Nov-2020 02:18, PREVIOUS  ECG IS PRESENT Confirmed by Julien Nordmann [phone]) on 08/09/2022 11:20:35 AM    PMH:   has a past medical history of A-fib (HCC), BPH (benign prostatic hyperplasia), Hypertension, and TIA (transient ischemic attack).  PSH:    Past Surgical History:  Procedure Laterality Date   CAROTID PTA/STENT INTERVENTION Left 09/30/2020   Procedure: CAROTID PTA/STENT INTERVENTION;  Surgeon: Annice Needy, MD;  Location: [facility] INVASIVE CV LAB;  Service: Cardiovascular;  Laterality: Left;   TONSILLECTOMY      Current Outpatient Medications  Medication Sig Dispense Refill   atorvastatin (LIPITOR) 40 MG tablet Take 1 tablet (40 mg total) by mouth at bedtime. 30 tablet 0   carbidopa-levodopa (SINEMET IR) 10-100 MG tablet Take 1 tablet by mouth 3 (three) times daily.     Cholecalciferol (D3-1000) 25 MCG (1000 UT) capsule Take 1,000 Units by mouth daily.     diltiazem (CARDIZEM CD) 120 MG 24 hr capsule Take 120 mg by mouth daily.     ELIQUIS 2.5 MG TABS tablet SMARTSIG:1 Tablet(s) By Mouth Every 12 Hours     Iron-Vitamin C (VITRON-C PO) Take by mouth daily.     lamoTRIgine (LAMICTAL) 25 MG tablet Take 50 mg by mouth 2 (two) times daily.     Multiple Vitamin (MULTIVITAMIN) tablet Take 1 tablet by mouth daily.     sertraline (ZOLOFT) 50 MG tablet Take 1 tablet by mouth daily.     tamsulosin (FLOMAX) 0.4 MG CAPS capsule Take 0.4 mg by mouth daily.     vitamin B-12 2000 MCG tablet Take  1 tablet (2,000 mcg total) by mouth daily. 30 tablet 0   zinc gluconate 50 MG tablet Take 50 mg by mouth daily.     No current facility-administered medications for this visit.     Allergies:   Patient has no known allergies.   Social History:  The patient  reports that he quit smoking about 46 years ago. His smoking use included cigarettes. He has never used smokeless tobacco. He reports that he does not drink alcohol and does not use drugs.   Family History:   family history is not on file.    Review of Systems: Review  of Systems  Constitutional: Negative.   HENT: Negative.    Respiratory: Negative.    Cardiovascular: Negative.   Gastrointestinal: Negative.   Musculoskeletal: Negative.   Neurological: Negative.   Psychiatric/Behavioral: Negative.    All other systems reviewed and are negative.    PHYSICAL EXAM: VS:  BP 118/80 (BP Location: Left Arm, Patient Position: Sitting, Cuff Size: Normal)   Pulse 83   Ht 5\' 11"  (1.803 m)   Wt 166 lb (75.3 kg)   BMI 23.15 kg/m  , BMI Body mass index is 23.15 kg/m. GEN: Well nourished, well developed, in no acute distress HEENT: normal Neck: no JVD, carotid bruits, or masses Cardiac: RRR; no murmurs, rubs, or gallops,no edema  Respiratory:  clear to auscultation bilaterally, normal work of breathing GI: soft, nontender, nondistended, + BS MS: no deformity or atrophy Skin: warm and dry, no rash Neuro:  Strength and sensation are intact Psych: euthymic mood, full affect   Recent Labs: No results found for requested labs within last 365 days.    Lipid Panel Lab Results  Component Value Date   CHOL 148 11/20/2020   HDL 62 11/20/2020   LDLCALC 76 11/20/2020   TRIG 50 11/20/2020      Wt Readings from Last 3 Encounters:  08/09/22 166 lb (75.3 kg)  08/24/21 154 lb (69.9 kg)  02/23/21 160 lb 6.4 oz (72.8 kg)       ASSESSMENT AND PLAN:  Problem List Items Addressed This Visit       Cardiology Problems   Carotid stenosis, symptomatic, with infarction [facility])   Essential hypertension   Atrial fibrillation, chronic (HCC)   Relevant Orders   EKG 12-Lead (Completed)   TIA (transient ischemic attack)     Other   Frequent nosebleeds   Other Visit Diagnoses     PAD (peripheral artery disease) (HCC)    -  Primary      Permanent atrial fibrillation Rate controlled on diltiazem ER 120 daily Wife reports he has been on Eliquis 2.5 twice daily for many years We discussed that dosing is borderline suboptimal though with creatinine 1.4 and  age over 14, and given history of nosebleeds, we can continue at current dose Discussed that lower dose Eliquis typically reserved for creatinine 1.5 or greater Wife reports no TIA or stroke symptoms  Parkinson's Reports diagnosis of Parkinson's Recommended regular exercise program, strengthening activities  PAD Prior history of stroke, carotid intervention Previously on aspirin Plavix, this was held for nosebleeds  Hyperlipidemia Tolerating Lipitor 40 daily, goal LDL less than 70  Total encounter time more than 60 minutes Greater than 50% was spent in counseling and coordination of care with the patient    Signed, Dossie Arbour, M.D., Ph.D. [facility] [place], [place] [phone]

## 2022-08-09 ENCOUNTER — Ambulatory Visit: Payer: Medicare Other | Attending: Cardiovascular Disease | Admitting: Cardiovascular Disease

## 2022-08-09 ENCOUNTER — Encounter: Payer: Self-pay | Admitting: Cardiovascular Disease

## 2022-08-09 VITALS — BP 118/80 | HR 83 | Ht 71.0 in | Wt 166.0 lb

## 2022-08-09 DIAGNOSIS — I739 Peripheral vascular disease, unspecified: Secondary | ICD-10-CM

## 2022-08-09 DIAGNOSIS — I10 Essential (primary) hypertension: Secondary | ICD-10-CM

## 2022-08-09 DIAGNOSIS — R040 Epistaxis: Secondary | ICD-10-CM

## 2022-08-09 DIAGNOSIS — I63239 Cerebral infarction due to unspecified occlusion or stenosis of unspecified carotid arteries: Secondary | ICD-10-CM | POA: Diagnosis not present

## 2022-08-09 DIAGNOSIS — I482 Chronic atrial fibrillation, unspecified: Secondary | ICD-10-CM

## 2022-08-09 DIAGNOSIS — G459 Transient cerebral ischemic attack, unspecified: Secondary | ICD-10-CM

## 2022-08-09 NOTE — Patient Instructions (Addendum)
Medication Instructions:  No changes  If you need a refill on your cardiac medications before your next appointment, please call your pharmacy.   Lab work: No new labs needed  Testing/Procedures: No new testing needed  Follow-Up: At [facility], you and your health needs are our priority.  As part of our continuing mission to provide you with exceptional heart care, we have created designated Provider Care Teams.  These Care Teams include your primary Cardiologist (physician) and Advanced Practice Providers (APPs -  Physician Assistants and Nurse Practitioners) who all work together to provide you with the care you need, when you need it.  You will need a follow up appointment in 6-9 months  Providers on your designated Care Team:   Nicolasa Ducking, NP Eula Listen, PA-C Cadence Fransico [place], [place]  COVID-19 Vaccine Information can be found at: [email or web address] For questions related to vaccine distribution or appointments, please email [email or web address] or call [phone].

## 2022-08-29 ENCOUNTER — Other Ambulatory Visit (INDEPENDENT_AMBULATORY_CARE_PROVIDER_SITE_OTHER): Payer: Self-pay | Admitting: Vascular Surgery

## 2022-08-29 DIAGNOSIS — I6523 Occlusion and stenosis of bilateral carotid arteries: Secondary | ICD-10-CM

## 2022-08-30 ENCOUNTER — Encounter (INDEPENDENT_AMBULATORY_CARE_PROVIDER_SITE_OTHER): Payer: Self-pay | Admitting: Vascular Surgery

## 2022-08-30 ENCOUNTER — Ambulatory Visit (INDEPENDENT_AMBULATORY_CARE_PROVIDER_SITE_OTHER): Payer: Medicare Other

## 2022-08-30 ENCOUNTER — Ambulatory Visit (INDEPENDENT_AMBULATORY_CARE_PROVIDER_SITE_OTHER): Payer: Medicare Other | Admitting: Vascular Surgery

## 2022-08-30 VITALS — BP 133/79 | HR 62 | Resp 16 | Wt 164.0 lb

## 2022-08-30 DIAGNOSIS — R040 Epistaxis: Secondary | ICD-10-CM | POA: Diagnosis not present

## 2022-08-30 DIAGNOSIS — I63239 Cerebral infarction due to unspecified occlusion or stenosis of unspecified carotid arteries: Secondary | ICD-10-CM | POA: Diagnosis not present

## 2022-08-30 DIAGNOSIS — I6523 Occlusion and stenosis of bilateral carotid arteries: Secondary | ICD-10-CM | POA: Diagnosis not present

## 2022-08-30 DIAGNOSIS — I10 Essential (primary) hypertension: Secondary | ICD-10-CM | POA: Diagnosis not present

## 2022-08-30 NOTE — Assessment & Plan Note (Signed)
blood pressure control important in reducing the progression of atherosclerotic disease. On appropriate oral medications.  

## 2022-08-30 NOTE — Assessment & Plan Note (Addendum)
His duplex today demonstrates 1 to 39% right ICA stenosis and a widely patent left carotid stent without evidence of recurrent stenosis.  Doing very well without any focal neurologic symptoms or recurrent problems since his intervention.  Remains on 2.5 mg of Eliquis.  Follow-up in 1 year.

## 2022-08-30 NOTE — Assessment & Plan Note (Signed)
Had previous coil embolization for treatment.  Not currently a problem.

## 2022-08-30 NOTE — Progress Notes (Signed)
MRN : [id number]  Ethan Ayers is a 82 y.o. ([birth year]) male who presents with chief complaint of  Chief Complaint  Patient presents with   Carotid    46yr ultrasound follow up  .  History of Present Illness: Patient returns in follow-up of his carotid disease.  He is about 2 years status post left carotid stent placement as well as coil embolization for intractable nosebleeds.  He is not currently having any nosebleeds and he is currently on 2.5 mg of Eliquis.  He had a stroke and TIA symptoms previously but has not had focal neurologic symptoms since his last visit.  He is doing quite well and his wife is extremely pleased with how much better he is doing now than when we first met him 2 years ago. His duplex today demonstrates 1 to 39% right ICA stenosis and a widely patent left carotid stent without evidence of recurrent stenosis.  Current Outpatient Medications  Medication Sig Dispense Refill   carbidopa-levodopa (SINEMET IR) 10-100 MG tablet Take 1 tablet by mouth 3 (three) times daily.     Cholecalciferol (D3-1000) 25 MCG (1000 UT) capsule Take 1,000 Units by mouth daily.     diltiazem (CARDIZEM CD) 120 MG 24 hr capsule Take 120 mg by mouth daily.     ELIQUIS 2.5 MG TABS tablet SMARTSIG:1 Tablet(s) By Mouth Every 12 Hours     Iron-Vitamin C (VITRON-C PO) Take by mouth daily.     lamoTRIgine (LAMICTAL) 25 MG tablet Take 50 mg by mouth 2 (two) times daily.     Multiple Vitamin (MULTIVITAMIN) tablet Take 1 tablet by mouth daily.     sertraline (ZOLOFT) 50 MG tablet Take 1 tablet by mouth daily.     tamsulosin (FLOMAX) 0.4 MG CAPS capsule Take 0.4 mg by mouth daily.     vitamin B-12 2000 MCG tablet Take 1 tablet (2,000 mcg total) by mouth daily. 30 tablet 0   zinc gluconate 50 MG tablet Take 50 mg by mouth daily.     atorvastatin (LIPITOR) 40 MG tablet Take 1 tablet (40 mg total) by mouth at bedtime. 30 tablet 0   No current facility-administered medications for this visit.     Past Medical History:  Diagnosis Date   A-fib [facility])    BPH (benign prostatic hyperplasia)    Hypertension    TIA (transient ischemic attack)     Past Surgical History:  Procedure Laterality Date   CAROTID PTA/STENT INTERVENTION Left 09/30/2020   Procedure: CAROTID PTA/STENT INTERVENTION;  Surgeon: Annice Needy, MD;  Location: [facility] INVASIVE CV LAB;  Service: Cardiovascular;  Laterality: Left;   TONSILLECTOMY       Social History   Tobacco Use   Smoking status: Former    Current packs/day: 0.00    Types: Cigarettes    Quit date: 01/18/1976    Years since quitting: 46.6   Smokeless tobacco: Never  Substance Use Topics   Alcohol use: No   Drug use: No      No family history on file.   No Known Allergies   REVIEW OF SYSTEMS (Negative unless checked)  Constitutional: [] Weight loss  [] Fever  [] Chills Cardiac: [] Chest pain   [] Chest pressure   [] Palpitations   [] Shortness of breath when laying flat   [] Shortness of breath at rest   [] Shortness of breath with exertion. Vascular:  [] Pain in legs with walking   [] Pain in legs at rest   [] Pain in legs when laying flat   []   Claudication   [] Pain in feet when walking  [] Pain in feet at rest  [] Pain in feet when laying flat   [] History of DVT   [] Phlebitis   [] Swelling in legs   [] Varicose veins   [] Non-healing ulcers Pulmonary:   [] Uses home oxygen   [] Productive cough   [] Hemoptysis   [] Wheeze  [] COPD   [] Asthma Neurologic:  [] Dizziness  [] Blackouts   [] Seizures   [x] History of stroke   [x] History of TIA  [] Aphasia   [] Temporary blindness   [] Dysphagia   [] Weakness or numbness in arms   [] Weakness or numbness in legs Musculoskeletal:  [x] Arthritis   [] Joint swelling   [x] Joint pain   [] Low back pain Hematologic:  [] Easy bruising  [] Easy bleeding   [] Hypercoagulable state   [] Anemic  [] Hepatitis Gastrointestinal:  [] Blood in stool   [] Vomiting blood  [] Gastroesophageal reflux/heartburn   [] Difficulty swallowing. Genitourinary:   [] Chronic kidney disease   [] Difficult urination  [x] Frequent urination  [] Burning with urination   [] Blood in urine Skin:  [] Rashes   [] Ulcers   [] Wounds Psychological:  [] History of anxiety   []  History of major depression.  Physical Examination  Vitals:   08/30/22 0950  BP: 133/79  Pulse: 62  Resp: 16  Weight: 164 lb (74.4 kg)   Body mass index is 22.87 kg/m. Gen:  WD/WN, NAD. Appears younger than stated age. Head: [place]/AT, No temporalis wasting. Ear/Nose/Throat: Hearing grossly intact, nares w/o erythema or drainage, trachea midline Eyes: Conjunctiva clear. Sclera non-icteric Neck: Supple.  No bruit  Pulmonary:  Good air movement, equal and clear to auscultation bilaterally.  Cardiac: irregular Vascular:  Vessel Right Left  Radial Palpable Palpable           Musculoskeletal: M/S 5/5 throughout.  No deformity or atrophy. No edema. Neurologic: CN 2-12 intact. Sensation grossly intact in extremities.  Symmetrical.  Speech is fluent. Motor exam as listed above. Psychiatric: Judgment intact, Mood & affect appropriate for pt's clinical situation. Dermatologic: No rashes or ulcers noted.  No cellulitis or open wounds.    CBC Lab Results  Component Value Date   WBC 9.0 11/20/2020   HGB 12.8 (L) 11/20/2020   HCT 38.8 (L) 11/20/2020   MCV 92.4 11/20/2020   PLT 264 11/20/2020    BMET    Component Value Date/Time   NA 134 (L) 11/21/2020 0650   K 3.2 (L) 11/21/2020 0650   CL 102 11/21/2020 0650   CO2 25 11/21/2020 0650   GLUCOSE 88 11/21/2020 0650   BUN 19 11/21/2020 0650   CREATININE 1.28 (H) 11/21/2020 0650   CALCIUM 8.9 11/21/2020 0650   GFRNONAA 56 (L) 11/21/2020 0650   CrCl cannot be calculated (Patient's most recent lab result is older than the maximum 21 days allowed.).  COAG Lab Results  Component Value Date   INR 1.1 11/20/2020   INR 3.6 (H) 09/22/2020    Radiology No results found.   Assessment/Plan Carotid stenosis, symptomatic, with infarction  (HCC) His duplex today demonstrates 1 to 39% right ICA stenosis and a widely patent left carotid stent without evidence of recurrent stenosis.  Doing very well without any focal neurologic symptoms or recurrent problems since his intervention.  Remains on 2.5 mg of Eliquis.  Follow-up in 1 year.  Essential hypertension blood pressure control important in reducing the progression of atherosclerotic disease. On appropriate oral medications.    Festus Barren, MD  08/30/2022 11:35 AM    This note was created with Dragon medical transcription system.  Any errors from dictation are purely unintentional

## 2022-09-19 IMAGING — MR MR LUMBAR SPINE W/O CM
5 series · 31 of 48 positions shown · non-contrast
Comparison: None.

CLINICAL DATA: Low back pain with bilateral sciatica, unspecified
back pain laterality, unspecified chronicity; lumbar radiculopathy.

EXAM:
MRI LUMBAR SPINE WITHOUT CONTRAST
TECHNIQUE: Multiplanar, multisequence MR imaging of the lumbar spine was
performed. No intravenous contrast was administered.

[Series 9: T2 · sagittal · 4.0mm · 0.81mm/px · 6 of 17 slices shown (1 of 2)]
[im 1/17]
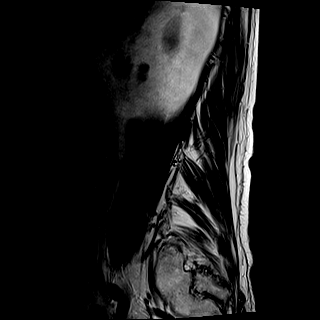
[im 4/17]
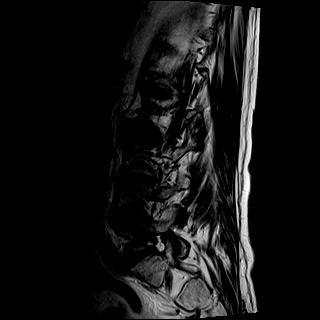
[im 7/17]
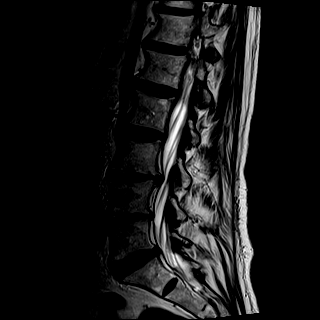
[im 10/17]
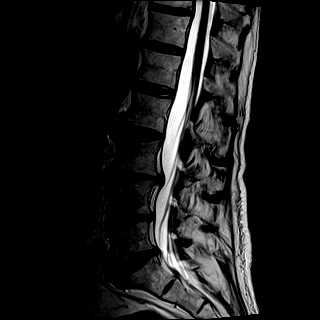
[im 13/17]
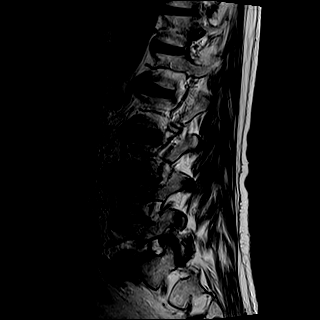
[im 17/17]
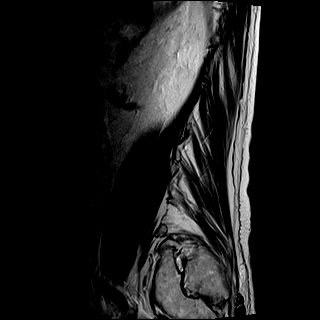

[Series 10: T1 · sagittal · 4.0mm · 0.81mm/px · 7 of 17 slices shown (1 of 2)]
[im 1/17]
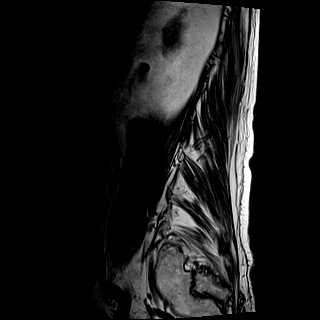
[im 3/17]
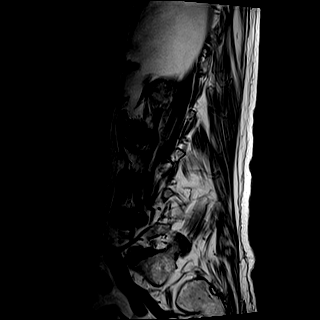
[im 6/17]
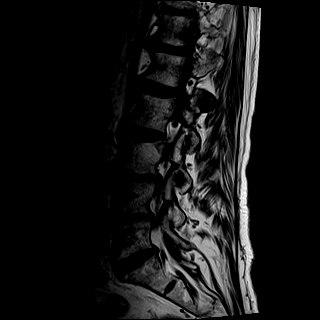
[im 9/17]
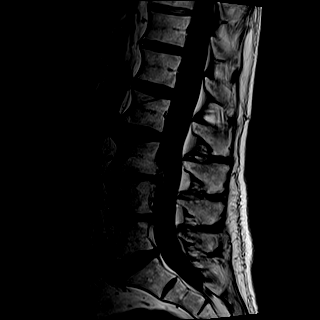
[im 11/17]
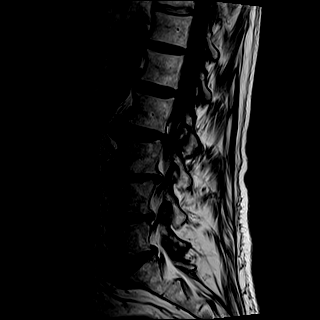
[im 14/17]
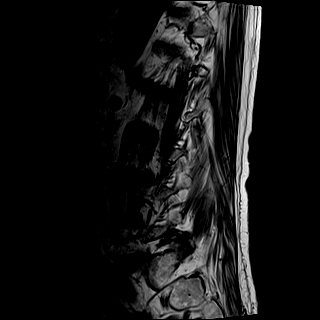
[im 17/17]
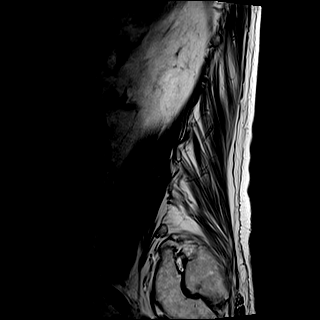

[Series 11: STIR · sagittal · 4.0mm · 0.41mm/px · 2 of 17 slices shown]
[im 1/17]
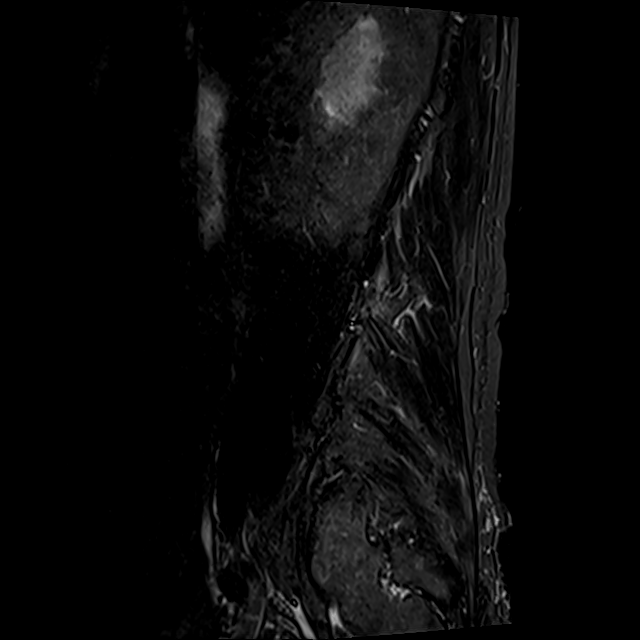
[im 3/17]
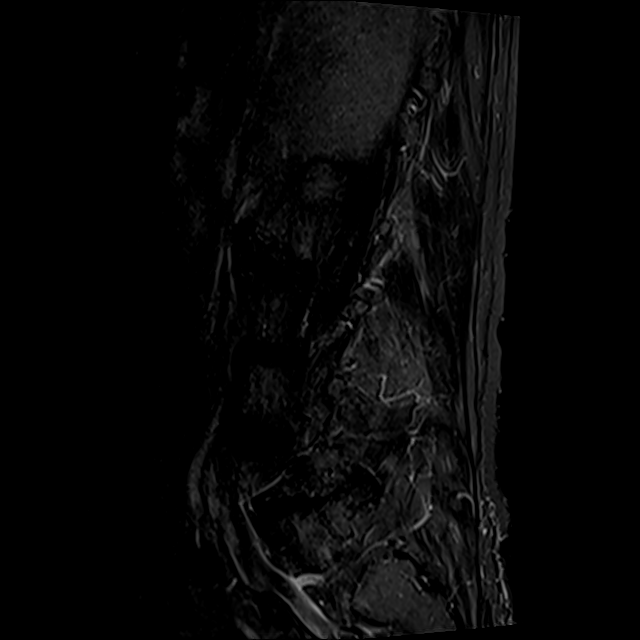

[Series 12: T2 · axial · 4.0mm · 0.78mm/px · z∈[-201,+18]mm · 8 of 37 slices shown (2 of 2)]
[im 1/37]
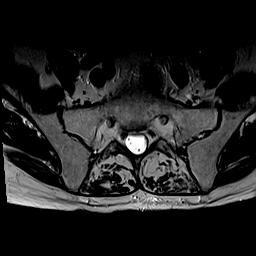
[im 6/37]
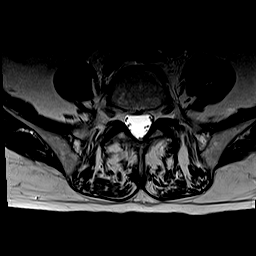
[im 12/37]
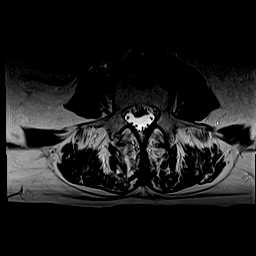
[im 17/37]
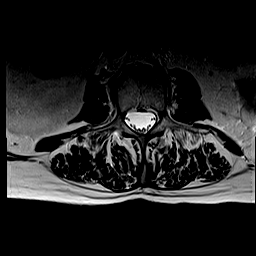
[im 20/37]
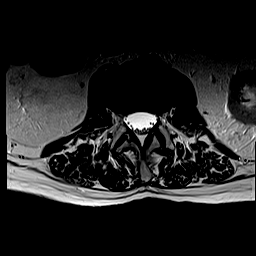
[im 25/37]
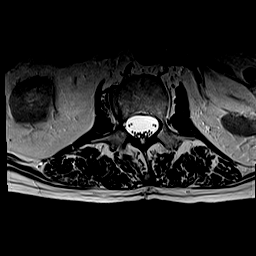
[im 31/37]
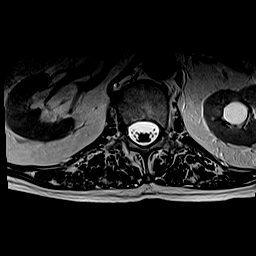
[im 37/37]
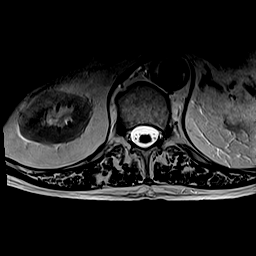

[Series 13: T1 · axial · 4.0mm · 0.39mm/px · z∈[-201,+18]mm · 8 of 37 slices shown (2 of 2)]
[im 1/37]
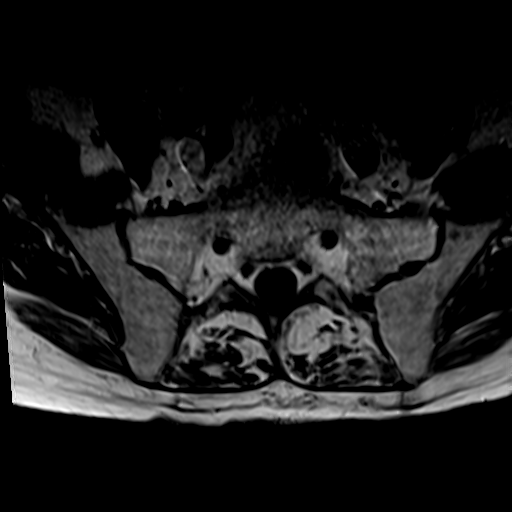
[im 6/37]
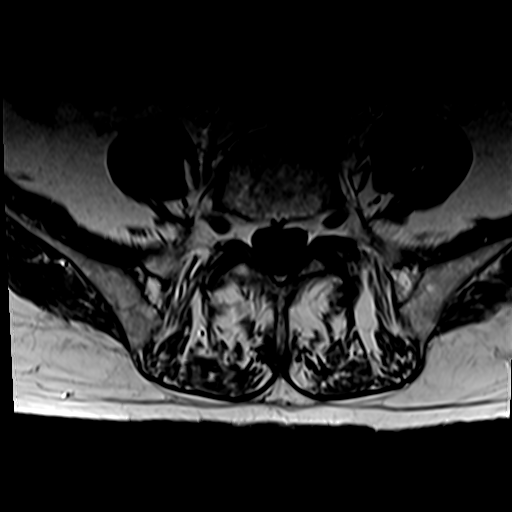
[im 12/37]
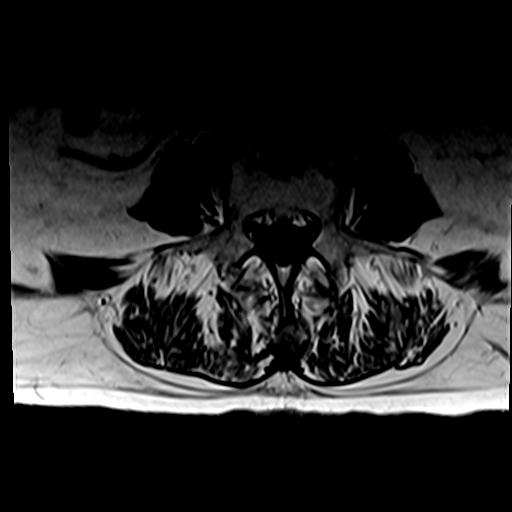
[im 17/37]
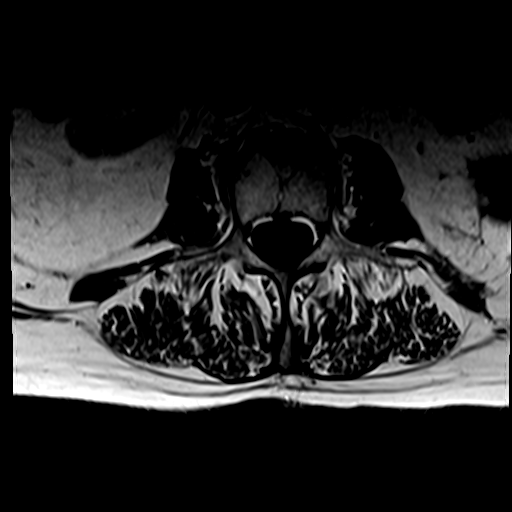
[im 20/37]
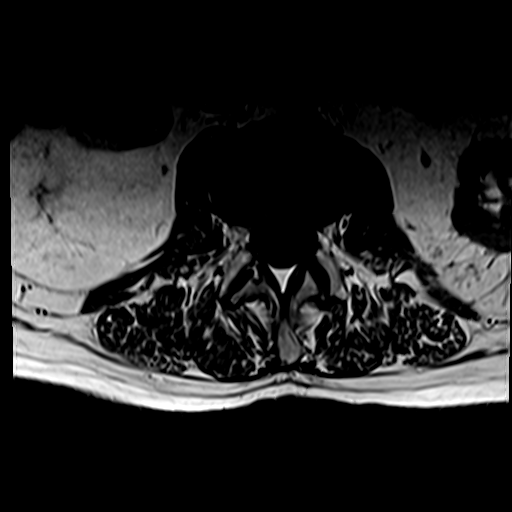
[im 25/37]
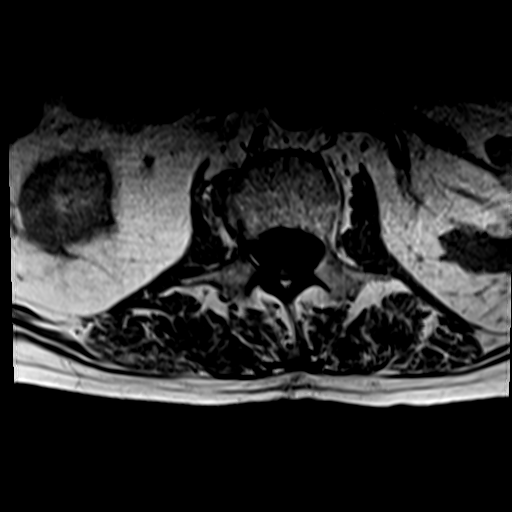
[im 31/37]
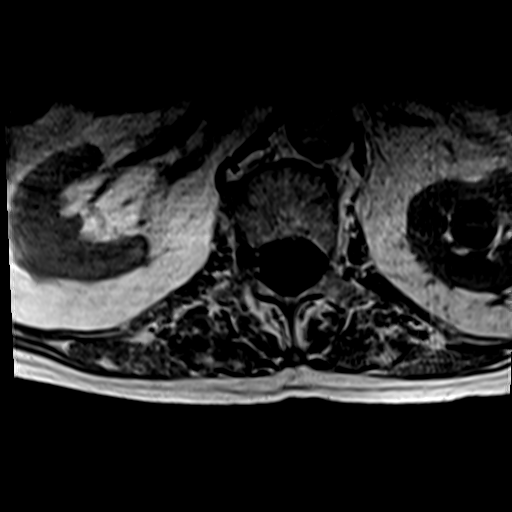
[im 37/37]
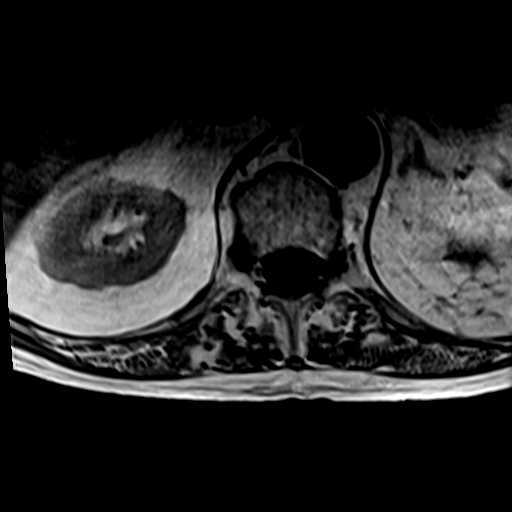

[31 of 48 positions shown; findings below may reference images not displayed]

FINDINGS: Segmentation:  Standard.

Alignment:  Physiologic.

Vertebrae: No fracture, evidence of discitis, or bone lesion.
Endplate degenerative changes at L3-4, L4-5 and L5-S1.

Conus medullaris and cauda equina: Conus extends to the L2 level.
Conus and cauda equina appear normal.

Paraspinal and other soft tissues: Negative.

Disc levels:

T12-L1: No spinal canal or neural foraminal stenosis.

L1-2: No spinal canal or neural foraminal stenosis.

L2-3: Mild facet degenerative changes. No spinal canal or neural
foraminal stenosis.

L3-4: Loss of disc height, disc bulge with associated osteophytic
component and superimposed central disc protrusion, mild facet
degenerative changes. Findings result in mild spinal canal stenosis
with narrowing of the bilateral subarticular zones and mild
bilateral neural foraminal narrowing.

L4-5: Loss of disc height, shallow disc bulge with superimposed
central disc protrusion mild facet degenerative changes without
significant spinal canal or neural foraminal stenosis.

L5-S1: Shallow disc bulge and moderate facet degenerative changes
resulting in mild left neural foraminal narrowing. No significant
spinal canal stenosis.
IMPRESSION: 1. Mild spinal canal stenosis with narrowing of the bilateral
subarticular zones and mild bilateral neural foraminal narrowing at
L3-4.
2. Mild left neural foraminal narrowing at L5-S1.

## 2023-05-10 NOTE — Progress Notes (Unsigned)
 Cardiology Office Note  Date:  05/12/2023   ID:  Ethan Ayers, DOB 19-Apr-1940, MRN [id number]  PCP:  Sari Cunning, MD   Chief Complaint  Patient presents with   6-9 month follow up     "Doing well."  Patient took a fall on Monday, has really bad bruising on his right arm.     HPI:  Mr. 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,  s/p L carotid artery Exact stent on 09/30/20  Nosebleeds on aspirin  Plavix  Permanent atrial fibrillation Essential hypertension Hyperlipidemia Chronic kidney disease stage III History of fall Who presents for follow-up of his atrial fibrillation  Last seen by myself in clinic July 2024 In follow-up today reports having a fall at home while moving the garbage can Ecchymotic bruising to right arm, around the elbow Feels that he probably should have gone to the ER to have a stitch or to put in but managed it at home with bandage Mild discomfort, moderate swelling, bruising stable Remained on Eliquis  throughout  Denies significant chest pain or shortness of breath concerning for angina No leg swelling, no PND orthopnea Rate well-controlled on diltiazem   Labs reviewed Total cholesterol 140 LDL 72 A1c 5.8 Creatinine 1.4 Hemoglobin 14.7  EKG personally reviewed by myself on todays visit EKG Interpretation Date/Time:  Friday May 12 2023 11:26:54 EDT Ventricular Rate:  86 PR Interval:    QRS Duration:  68 QT Interval:  352 QTC Calculation: 421 R Axis:   69  Text Interpretation: Atrial fibrillation Septal infarct (cited on or before 09-Aug-2022) When compared with ECG of 09-Aug-2022 10:46, No significant change was found Confirmed by Belva Boyden [phone]) on 05/12/2023 11:39:33 AM    ECHO 11/21/20 showed EF 55-60% with LVH, Grade 1 diastolic dysfunction, bilateral moderate atrial enlargement, and mild MR, aortic sclerosis without stenosis or regurgitation. Right  atrial pressure of 3 mmHg.   [facility] ED with stroke on 09/22/20. MRI and MRA brain showed severely stenotic distal right vertebral artery with poor flow.  Carotid Dopplers notable for heterogeneous plaque at the left carotid bifurcation contributing to 50 to 69% stenosis  Left carotid artery Exact stent with the use of the NAV-6 embolic protection device placement on 09/30/20 by Dr. Mikki Alexander    Plavix  and Aspirin  until 2023 when he had 4 major nosebleeds, which required cauterization to the right maxillary artery x2.  Plavix  and Aspirin  were D/C'd by Vascular.   carotid US  on 08/24/21 showed 1 to 39% right ICA stenosis and widely patent left carotid stent without instent stenosis.  on atorvastatin  40mg  daily.     PMH:   has a past medical history of A-fib (HCC), BPH (benign prostatic hyperplasia), Hypertension, and TIA (transient ischemic attack).  PSH:    Past Surgical History:  Procedure Laterality Date   CAROTID PTA/STENT INTERVENTION Left 09/30/2020   Procedure: CAROTID PTA/STENT INTERVENTION;  Surgeon: Celso College, MD;  Location: [facility] INVASIVE CV LAB;  Service: Cardiovascular;  Laterality: Left;   TONSILLECTOMY      Current Outpatient Medications  Medication Sig Dispense Refill   atorvastatin  (LIPITOR) 40 MG tablet Take 1 tablet (40 mg total) by mouth at bedtime. 30 tablet 0   carbidopa-levodopa (SINEMET IR) 10-100 MG tablet Take 1 tablet by mouth 3 (three) times daily.     Cholecalciferol (D3-1000) 25 MCG (1000 UT) capsule Take 1,000 Units by mouth daily.     diltiazem  (CARDIZEM   CD) 120 MG 24 hr capsule Take 120 mg by mouth daily.     ELIQUIS  2.5 MG TABS tablet SMARTSIG:1 Tablet(s) By Mouth Every 12 Hours     Iron-Vitamin C (VITRON-C PO) Take by mouth daily.     lamoTRIgine (LAMICTAL) 25 MG tablet Take 50 mg by mouth 2 (two) times daily.     Multiple Vitamin (MULTIVITAMIN) tablet Take 1 tablet by mouth daily.     sertraline (ZOLOFT) 50 MG tablet Take 1 tablet by mouth daily.      tamsulosin  (FLOMAX ) 0.4 MG CAPS capsule Take 0.4 mg by mouth daily.     vitamin B-12 2000 MCG tablet Take 1 tablet (2,000 mcg total) by mouth daily. 30 tablet 0   zinc gluconate 50 MG tablet Take 50 mg by mouth daily.     No current facility-administered medications for this visit.   Allergies:   Patient has no known allergies.   Social History:  The patient  reports that he quit smoking about 47 years ago. His smoking use included cigarettes. He has never used smokeless tobacco. He reports that he does not drink alcohol and does not use drugs.   Family History:   family history is not on file.   Review of Systems: Review of Systems  Constitutional: Negative.   HENT: Negative.    Respiratory: Negative.    Cardiovascular: Negative.   Gastrointestinal: Negative.   Musculoskeletal: Negative.   Neurological: Negative.   Psychiatric/Behavioral: Negative.    All other systems reviewed and are negative.  PHYSICAL EXAM: VS:  BP 130/70 (BP Location: Left Arm, Patient Position: Sitting, Cuff Size: Normal)   Pulse 86   Ht 5\' 11"  (1.803 m)   Wt 160 lb (72.6 kg)   SpO2 98%   BMI 22.32 kg/m  , BMI Body mass index is 22.32 kg/m. Constitutional:  oriented to person, place, and time. No distress.  HENT:  Head: Grossly normal Eyes:  no discharge. No scleral icterus.  Neck: No JVD, no carotid bruits  Cardiovascular: Regular rate and rhythm, no murmurs appreciated Pulmonary/Chest: Clear to auscultation bilaterally, no wheezes or rales Abdominal: Soft.  no distension.  no tenderness.  Musculoskeletal: Normal range of motion Neurological:  normal muscle tone. Coordination normal. No atrophy Skin: Skin warm and dry, ecchymotic bruising right elbow, bandage in place Psychiatric: normal affect, pleasant   Recent Labs: No results found for requested labs within last 365 days.    Lipid Panel Lab Results  Component Value Date   CHOL 148 11/20/2020   HDL 62 11/20/2020   LDLCALC 76  11/20/2020   TRIG 50 11/20/2020      Wt Readings from Last 3 Encounters:  05/12/23 160 lb (72.6 kg)  08/30/22 164 lb (74.4 kg)  08/09/22 166 lb (75.3 kg)    ASSESSMENT AND PLAN:  Problem List Items Addressed This Visit       Cardiology Problems   Carotid stenosis, symptomatic, with infarction (HCC)   Relevant Orders   EKG 12-Lead (Completed)   Essential hypertension   Relevant Orders   EKG 12-Lead (Completed)   Atrial fibrillation, chronic (HCC)   Relevant Orders   EKG 12-Lead (Completed)   TIA (transient ischemic attack)   Other Visit Diagnoses       PAD (peripheral artery disease) (HCC)    -  Primary   Relevant Orders   EKG 12-Lead (Completed)       Permanent atrial fibrillation Rate controlled with diltiazem  ER 120 daily Has been on  Eliquis  2.5 twice daily for many years  dosing is borderline suboptimal though with creatinine 1.4 and age over 74, and given history of nosebleeds, and recent fall we can continue at current dose  no recent TIA or stroke symptoms  Parkinson's 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 [facility] [place], [place] [phone]

## 2023-05-12 ENCOUNTER — Encounter: Payer: Self-pay | Admitting: Cardiovascular Disease

## 2023-05-12 ENCOUNTER — Ambulatory Visit: Attending: Cardiovascular Disease | Admitting: Cardiovascular Disease

## 2023-05-12 VITALS — BP 130/70 | HR 86 | Ht 71.0 in | Wt 160.0 lb

## 2023-05-12 DIAGNOSIS — G459 Transient cerebral ischemic attack, unspecified: Secondary | ICD-10-CM

## 2023-05-12 DIAGNOSIS — I739 Peripheral vascular disease, unspecified: Secondary | ICD-10-CM | POA: Diagnosis not present

## 2023-05-12 DIAGNOSIS — I63239 Cerebral infarction due to unspecified occlusion or stenosis of unspecified carotid arteries: Secondary | ICD-10-CM

## 2023-05-12 DIAGNOSIS — I482 Chronic atrial fibrillation, unspecified: Secondary | ICD-10-CM | POA: Diagnosis not present

## 2023-05-12 DIAGNOSIS — I10 Essential (primary) hypertension: Secondary | ICD-10-CM | POA: Diagnosis not present

## 2023-05-12 NOTE — Patient Instructions (Signed)

## 2023-05-16 ENCOUNTER — Ambulatory Visit: Payer: Medicare Other | Admitting: Cardiovascular Disease

## 2023-09-01 ENCOUNTER — Other Ambulatory Visit (INDEPENDENT_AMBULATORY_CARE_PROVIDER_SITE_OTHER): Payer: Self-pay | Admitting: Vascular Surgery

## 2023-09-01 DIAGNOSIS — I63239 Cerebral infarction due to unspecified occlusion or stenosis of unspecified carotid arteries: Secondary | ICD-10-CM

## 2023-09-05 ENCOUNTER — Encounter (INDEPENDENT_AMBULATORY_CARE_PROVIDER_SITE_OTHER): Payer: Self-pay | Admitting: Vascular Surgery

## 2023-09-05 ENCOUNTER — Ambulatory Visit (INDEPENDENT_AMBULATORY_CARE_PROVIDER_SITE_OTHER): Payer: Medicare Other

## 2023-09-05 ENCOUNTER — Ambulatory Visit (INDEPENDENT_AMBULATORY_CARE_PROVIDER_SITE_OTHER): Payer: Medicare Other | Admitting: Vascular Surgery

## 2023-09-05 VITALS — BP 124/82 | HR 84 | Ht 71.0 in | Wt 159.2 lb

## 2023-09-05 DIAGNOSIS — I10 Essential (primary) hypertension: Secondary | ICD-10-CM

## 2023-09-05 DIAGNOSIS — R040 Epistaxis: Secondary | ICD-10-CM

## 2023-09-05 DIAGNOSIS — I482 Chronic atrial fibrillation, unspecified: Secondary | ICD-10-CM

## 2023-09-05 DIAGNOSIS — I63239 Cerebral infarction due to unspecified occlusion or stenosis of unspecified carotid arteries: Secondary | ICD-10-CM

## 2023-09-05 NOTE — Assessment & Plan Note (Signed)
 Not since maxillary artery embolization.

## 2023-09-05 NOTE — Assessment & Plan Note (Signed)
 His duplex shows minimal right carotid disease in the 1 to 39% range and a widely patent left carotid stent. No medication changes. Recheck in one year.

## 2023-09-05 NOTE — Assessment & Plan Note (Signed)
 blood pressure control important in reducing the progression of atherosclerotic disease. On appropriate oral medications.

## 2023-09-05 NOTE — Progress Notes (Signed)
 MRN : [id number]  Ethan Ayers is a 83 y.o. (1940-11-29) male who presents with chief complaint of  Chief Complaint  Patient presents with   Carotid    - 1 year follow up with carotid   .  History of Present Illness: Patient returns today in follow up of his carotid disease.  He is doing quite well.  He is almost 3 years status post left carotid stent as well as embolization of the right maxillary artery for nosebleeds.  He said no further nosebleeds.  He has no focal neurologic symptoms.  From a neurologic standpoint, he is doing much better currently than he was prior to the stent.  He and his wife are very pleased and he is in good spirits and health today.  His duplex shows minimal right carotid disease in the 1 to 39% range and a widely patent left carotid stent.  Current Outpatient Medications  Medication Sig Dispense Refill   atorvastatin  (LIPITOR) 40 MG tablet Take 1 tablet (40 mg total) by mouth at bedtime. 30 tablet 0   carbidopa-levodopa (SINEMET IR) 10-100 MG tablet Take 1 tablet by mouth 3 (three) times daily.     Cholecalciferol (D3-1000) 25 MCG (1000 UT) capsule Take 1,000 Units by mouth daily.     diltiazem  (CARDIZEM  CD) 120 MG 24 hr capsule Take 120 mg by mouth daily.     ELIQUIS  2.5 MG TABS tablet SMARTSIG:1 Tablet(s) By Mouth Every 12 Hours     Iron-Vitamin C (VITRON-C PO) Take by mouth daily.     lamoTRIgine (LAMICTAL) 25 MG tablet Take 50 mg by mouth 2 (two) times daily.     Multiple Vitamin (MULTIVITAMIN) tablet Take 1 tablet by mouth daily.     sertraline (ZOLOFT) 50 MG tablet Take 1 tablet by mouth daily.     tamsulosin  (FLOMAX ) 0.4 MG CAPS capsule Take 0.4 mg by mouth daily.     vitamin B-12 2000 MCG tablet Take 1 tablet (2,000 mcg total) by mouth daily. 30 tablet 0   zinc gluconate 50 MG tablet Take 50 mg by mouth daily.     No current facility-administered medications for this visit.    Past Medical History:  Diagnosis Date   A-fib [facility])    BPH  (benign prostatic hyperplasia)    Hypertension    TIA (transient ischemic attack)     Past Surgical History:  Procedure Laterality Date   CAROTID PTA/STENT INTERVENTION Left 09/30/2020   Procedure: CAROTID PTA/STENT INTERVENTION;  Surgeon: Marea Selinda RAMAN, MD;  Location: [facility] INVASIVE CV LAB;  Service: Cardiovascular;  Laterality: Left;   TONSILLECTOMY       Social History   Tobacco Use   Smoking status: Former    Current packs/day: 0.00    Types: Cigarettes    Quit date: 01/18/1976    Years since quitting: 47.6   Smokeless tobacco: Never  Vaping Use   Vaping status: Never Used  Substance Use Topics   Alcohol use: No   Drug use: No      History reviewed. No pertinent family history.   No Known Allergies  REVIEW OF SYSTEMS (Negative unless checked)   Constitutional: [] Weight loss  [] Fever  [] Chills Cardiac: [] Chest pain   [] Chest pressure   [] Palpitations   [] Shortness of breath when laying flat   [] Shortness of breath at rest   [] Shortness of breath with exertion. Vascular:  [] Pain in legs with walking   [] Pain in legs at rest   [] Pain  in legs when laying flat   [] Claudication   [] Pain in feet when walking  [] Pain in feet at rest  [] Pain in feet when laying flat   [] History of DVT   [] Phlebitis   [] Swelling in legs   [] Varicose veins   [] Non-healing ulcers Pulmonary:   [] Uses home oxygen   [] Productive cough   [] Hemoptysis   [] Wheeze  [] COPD   [] Asthma Neurologic:  [] Dizziness  [] Blackouts   [] Seizures   [x] History of stroke   [x] History of TIA  [] Aphasia   [] Temporary blindness   [] Dysphagia   [] Weakness or numbness in arms   [] Weakness or numbness in legs Musculoskeletal:  [x] Arthritis   [] Joint swelling   [x] Joint pain   [] Low back pain Hematologic:  [] Easy bruising  [] Easy bleeding   [] Hypercoagulable state   [] Anemic  [] Hepatitis Gastrointestinal:  [] Blood in stool   [] Vomiting blood  [] Gastroesophageal reflux/heartburn   [] Difficulty swallowing. Genitourinary:  [] Chronic  kidney disease   [] Difficult urination  [x] Frequent urination  [] Burning with urination   [] Blood in urine Skin:  [] Rashes   [] Ulcers   [] Wounds Psychological:  [] History of anxiety   []  History of major depression.  Physical Examination  BP 124/82   Pulse 84   Ht 5' 11 (1.803 m)   Wt 159 lb 3.2 oz (72.2 kg)   BMI 22.20 kg/m  Gen:  WD/WN, NAD. Appears younger than stated age. Head: [place]/AT, No temporalis wasting. Ear/Nose/Throat: Hearing grossly intact, nares w/o erythema or drainage Eyes: Conjunctiva clear. Sclera non-icteric Neck: Supple.  Trachea midline Pulmonary:  Good air movement, no use of accessory muscles.  Cardiac: irregular Vascular:  Vessel Right Left  Radial Palpable Palpable               Musculoskeletal: M/S 5/5 throughout.  No deformity or atrophy. No edema. Neurologic: Sensation grossly intact in extremities.  Symmetrical.  Speech is fluent.  Psychiatric: Judgment intact, Mood & affect appropriate for pt's clinical situation. Dermatologic: No rashes or ulcers noted.  No cellulitis or open wounds.      Labs No results found for this or any previous visit (from the past 2160 hours).  Radiology No results found.  Assessment/Plan  Essential hypertension blood pressure control important in reducing the progression of atherosclerotic disease. On appropriate oral medications.   Atrial fibrillation, chronic (HCC) On Eliquis   Frequent nosebleeds Not since maxillary artery embolization.  Carotid stenosis, symptomatic, with infarction (HCC) His duplex shows minimal right carotid disease in the 1 to 39% range and a widely patent left carotid stent. No medication changes. Recheck in one year.     Selinda Gu, MD  09/05/2023 4:47 PM    This note was created with Dragon medical transcription system.  Any errors from dictation are purely unintentional

## 2023-09-05 NOTE — Assessment & Plan Note (Signed)
 On Eliquis 

## 2024-09-03 ENCOUNTER — Encounter (INDEPENDENT_AMBULATORY_CARE_PROVIDER_SITE_OTHER)

## 2024-09-03 ENCOUNTER — Ambulatory Visit (INDEPENDENT_AMBULATORY_CARE_PROVIDER_SITE_OTHER): Admitting: Vascular Surgery
